# Patient Record
Sex: Male | Born: 1995 | Race: White | Hispanic: No | Marital: Single | Smoking: Never smoker
Health system: Southern US, Community
[De-identification: ages and names within clinical notes are randomized; demographics above are authoritative.]

---
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
## 2017-06-08 ENCOUNTER — Ambulatory Visit (INDEPENDENT_AMBULATORY_CARE_PROVIDER_SITE_OTHER): Payer: BLUE CROSS/BLUE SHIELD | Admitting: Cardiovascular Disease

## 2017-06-08 ENCOUNTER — Encounter: Payer: Self-pay | Admitting: Cardiovascular Disease

## 2017-06-08 VITALS — BP 128/88 | HR 92 | Ht 71.0 in | Wt 137.2 lb

## 2017-06-08 DIAGNOSIS — Z13220 Encounter for screening for lipoid disorders: Secondary | ICD-10-CM | POA: Diagnosis not present

## 2017-06-08 DIAGNOSIS — Z720 Tobacco use: Secondary | ICD-10-CM

## 2017-06-08 DIAGNOSIS — Z79899 Other long term (current) drug therapy: Secondary | ICD-10-CM

## 2017-06-08 DIAGNOSIS — I471 Supraventricular tachycardia: Secondary | ICD-10-CM

## 2017-06-08 DIAGNOSIS — Z789 Other specified health status: Secondary | ICD-10-CM

## 2017-06-08 DIAGNOSIS — F1510 Other stimulant abuse, uncomplicated: Secondary | ICD-10-CM

## 2017-06-08 MED ORDER — METOPROLOL TARTRATE 25 MG PO TABS: 25.0000 mg | ORAL_TABLET | ORAL | 3 refills | Status: DC | PRN

## 2017-06-08 NOTE — Patient Instructions (Signed)
Medication Instructions:  START metoprolol tartrate (Lopressor) 25 mg AS NEEDED --take 1 tablet tonight  Labwork: Please return for FASTING labs (CMET, CBC, Lipid, TSH, Mag, T4-free)  Our in office lab hours are Monday-Friday 8:00-4:00, closed for lunch 12:45-1:45 pm.  No appointment needed.  Testing/Procedures: Your physician has requested that you have an echocardiogram. Echocardiography is a painless test that uses sound waves to create images of your heart. It provides your doctor with information about the size and shape of your heart and how well your heart's chambers and valves are working. This procedure takes approximately one hour. There are no restrictions for this procedure. [facility]:  [address]  Your physician has recommended that you wear a 2 week event monitor. Event monitors are medical devices that record the heart's electrical activity. Doctors most often us these monitors to diagnose arrhythmias. Arrhythmias are problems with the speed or rhythm of the heartbeat. The monitor is a small, portable device. You can wear one while you do your normal daily activities. This is usually used to diagnose what is causing palpitations/syncope (passing out).  Follow-Up: After testing with Dr. Tresa EndoKelly  Any Other Special Instructions Will Be Listed Below (If Applicable).     If you need a refill on your cardiac medications before your next appointment, please call your pharmacy.

## 2017-06-08 NOTE — Progress Notes (Signed)
Cardiology Office Note    Date:  06/10/2017   ID:  John Rowland, DOB 1995/12/30, MRN [id number]  PCP:  Aurelio Jew, MD (Inactive) .  Dr. Lupe Carney  Cardiologist:  Nicki Guadalajara, MD   New cardiology consultation, referred through Ma Hillock, [place] for evaluation of tachycardia.   History of Present Illness:  John Rowland is a 23 y.o. male who was seen at [facility] today by Ma Hillock with a chief complaint of tachycardia.  He was referred for cardiology consultation.  "John Rushing" Rowland  denies any known awareness of prior cardiac disease.  However, he admits to being overactive.  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  Last evening he was still up with friends at around 2 AM when he had to notice an abrupt increase in his heart rate, visual blurriness, decreased hearing, and a vein sharp burning discomfort.  By his calculation his heart rate increased to over 200 bpm.  Ultimately subsided.  This morning he went to work, and again while at work his heart rate began to increase significantly elevated.  He presented to the [facility] and was seen by Ma Hillock. PA.  An ECG by her was reportedly sinus tachycardia at 147.  He was asymptomatic at that time.  She had called the office requesting that he be seen today.  He is being seen by me as an add on.  Upon further questioning, the patient has significant caffeine intake.  He drinks 1-2 energy/vitamin drinks a day of 12 ounces of XS.  Researching this produc  reveals that each of these 12 ounce cans has 125 mg of caffeine.  In addition, he often drinks [facility] as well as sweet tea daily. He has been drinking these energy drinks for at least 6 months.  He does smoke a vape and in past few cigarettes intermittently.  He does use marijuana occasionally.  According to his mother who is here with him today, he has always been somewhat overactive.  He was never treated with ADHD drugs due to their concern.  Of  note, his father has a history of  atrial fibrillation and had undergone ablation.  Upon further questioning, the patient states that while growing up he had noticed periods where his heart rate was seen to race periodically.  He denies any episodes of presyncope or syncope.  He denies any exertional precipitating chest tightness.  He denies PND orthopnea.  He denies difficulty with sleep.  There is no past medical history.  There is no history of prior surgeries.  Current Medications: No outpatient medications prior to visit.   No facility-administered medications prior to visit.      Allergies:   Patient has no known allergies.   Social History   Socioeconomic History  . Marital status: Single    Spouse name: None  . Number of children: None  . Years of education: None  . Highest education level: None  Social Needs  . Financial resource strain: None  . Food insecurity - worry: None  . Food insecurity - inability: None  . Transportation needs - medical: None  . Transportation needs - non-medical: None  Occupational History  . None  Tobacco Use  . Smoking status: Never Smoker  . Smokeless tobacco: Never Used  Substance and Sexual Activity  . Alcohol use: No  . Drug use: No  . Sexual activity: None  Other Topics Concern  . None  Social History Narrative  .  None    He completed 12th grade and graduated from [employer] [place]  Family History:  The patient's family history includes Atrial fibrillation in his father.   ROS General: Negative; No fevers, chills, or night sweats;  HEENT: Negative; No changes in vision or hearing, sinus congestion, difficulty swallowing Pulmonary: Negative; No cough, wheezing, shortness of breath, hemoptysis Cardiovascular:  See HPI GI: Negative; No nausea, vomiting, diarrhea, or abdominal pain GU: Negative; No dysuria, hematuria, or difficulty voiding Musculoskeletal: Negative; no myalgias, joint pain, or  weakness Hematologic/Oncology: Negative; no easy bruising, bleeding Endocrine: Negative; no heat/cold intolerance; no diabetes Neuro: Negative; no changes in balance, headaches Skin: Negative; No rashes or skin lesions Psychiatric: Negative; No behavioral problems, depression Sleep: Negative; No snoring, daytime sleepiness, hypersomnolence, bruxism, restless legs, hypnogognic hallucinations, no cataplexy Other comprehensive 14 point system review is negative.   PHYSICAL EXAM:   VS:  BP 128/88   Pulse 92   Ht 5\' 11"  (1.803 m)   Wt 137 lb 3.2 oz (62.2 kg)   BMI 19.14 kg/m     Repeat blood pressure by me was 122/84  Wt Readings from Last 3 Encounters:  06/08/17 137 lb 3.2 oz (62.2 kg)  11/08/13 142 lb 12.8 oz (64.8 kg) (49 %, Z= -0.03)*  05/20/13 143 lb (64.9 kg) (54 %, Z= 0.11)*   * Growth percentiles are based on CDC (Boys, 2-20 Years) data.    General: Alert, oriented, no distress.  Thin.  BMI of 19  Skin: normal turgor, no rashes, warm and dry HEENT: Normocephalic, atraumatic. Pupils equal round and reactive to light; sclera anicteric; extraocular muscles intact; Fundi normal Nose without nasal septal hypertrophy Mouth/Parynx benign; Mallinpatti scale 2 Neck: No JVD, no carotid bruits; normal carotid upstroke Lungs: clear to ausculatation and percussion; no wheezing or rales Chest wall: without tenderness to palpitationaint Heart: PMI not displaced, RRR, s1 s2 normal, faint 1/6 systolic murmur, no diastolic murmur, no rubs, gallops, thrills, or heaves Abdomen: scaphoid; soft, nontender; no hepatosplenomehaly, BS+; abdominal aorta nontender and not dilated by palpation. Back: no CVA tenderness Pulses 2+ Musculoskeletal: full range of motion, normal strength, no joint deformities Extremities: no clubbing cyanosis or edema, Homan's sign negative  Neurologic: grossly nonfocal; Cranial nerves grossly wnl Psychologic: Normal mood and affect   Studies/Labs Reviewed:   EKG:   EKG is ordered today.  ECG (independently read by me): normal sinus rhythm at 92 bpm.  QTc interval 413 ms.  No ST segment changes.  PR interval 140 ms.  I personally reviewed the ECG that was done at [facility] today which reveals probable ectopic atrial tachycardia with a rate at 147 bpm rather than sinus tachycardia.  Recent Labs: No flowsheet data found.   No flowsheet data found.  No flowsheet data found. No results found for: MCV No results found for: TSH No results found for: HGBA1C   BNP No results found for: BNP  ProBNP No results found for: PROBNP   Lipid Panel  No results found for: CHOL, TRIG, HDL, CHOLHDL, VLDL, LDLCALC, LDLDIRECT   RADIOLOGY: No results found.   Additional studies/ records that were reviewed today include:  Records from [facility]    ASSESSMENT:    1. Ectopic atrial tachycardia (HCC)   2. Medication management   3. Lipid screening   4. 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  Over the past 6 months, he has been drinking drinking 1-2  12 ounce cans of boost/vitamin drink he has several [employer], as well as sweet tea during the day.  He developed onset of rapid tachycardia which he believes his rate had increased to almost 240 last evening.  He developed recurrent tachydysrhythmia earlier today.  His ECG is highly suggestive of ectopic atrial tachycardia rather than sinus rhythm.  A follow-up ECG in our office today shows sinus rhythm at 92 bpm without evidence for preexcitation or accelerated AV conduction.  I had a long talk with both he and his mother.  His symptoms are undoubtedly exacerbated by his significant caffeine intake.  I discussed the importance of discontinuation of caffeine use.  I am scheduling him for an echo Doppler study.  Laboratory will be obtained  including a free T4, TSH, magnesium level, CME, CBC, and lipid studies.  He will wear an event monitor for 2 weeks.  I discussed with him significant lifestyle adjustment.  We discussed the opportunities that he has at age 47 today a difference in his future discussed the importance of discontinuing nicotine use.  I have given him a prescription for metoprolol to additionally take 25 mg today and then as needed.  Hopefully with caffeine reduction symptoms will abate and he will not need at present long-term treatment.  I will see him in 3 weeks for reevaluation.  Medication Adjustments/Labs and Tests Ordered: Current medicines are reviewed at length with the patient today.  Concerns regarding medicines are outlined above.  Medication changes, Labs and Tests ordered today are listed in the Patient Instructions below. Patient Instructions  Medication Instructions:  START metoprolol tartrate (Lopressor) 25 mg AS NEEDED --take 1 tablet tonight  Labwork: Please return for FASTING labs (CMET, CBC, Lipid, TSH, Mag, T4-free)  Our in office lab hours are Monday-Friday 8:00-4:00, closed for lunch 12:45-1:45 pm.  No appointment needed.  Testing/Procedures: Your physician has requested that you have an echocardiogram. Echocardiography is a painless test that uses sound waves to create images of your heart. It provides your doctor with information about the size and shape of your heart and how well your heart's chambers and valves are working. This procedure takes approximately one hour. There are no restrictions for this procedure. This will be done at our [facility] location:  [address]  Your physician has recommended that you wear a 2 week event monitor. Event monitors are medical devices that record the heart's electrical activity. Doctors most often [place] these monitors to diagnose arrhythmias. Arrhythmias are problems with the speed or rhythm of the heartbeat. The monitor is a small,  portable device. You can wear one while you do your normal daily activities. This is usually used to diagnose what is causing palpitations/syncope (passing out).  Follow-Up: After testing with Dr. Tresa Endo  Any Other Special Instructions Will Be Listed Below (If Applicable).     If you need a refill on your cardiac medications before your next appointment, please call your pharmacy.      Signed, Nicki Guadalajara, MD  06/10/2017 9:11 AM    [facility] [address], [place], [place]  [address] Phone: [phone]

## 2017-06-09 ENCOUNTER — Telehealth: Payer: Self-pay | Admitting: Cardiovascular Disease

## 2017-06-09 NOTE — Telephone Encounter (Signed)
Called patient to schedule echo and monitor, but, phone would not accept a voicemail.

## 2017-06-10 ENCOUNTER — Encounter: Payer: Self-pay | Admitting: Cardiovascular Disease

## 2017-06-15 ENCOUNTER — Other Ambulatory Visit (HOSPITAL_COMMUNITY): Payer: BLUE CROSS/BLUE SHIELD

## 2017-06-15 LAB — COMPREHENSIVE METABOLIC PANEL
A/G RATIO: 2.1 (ref 1.2–2.2)
ALBUMIN: 5 g/dL (ref 3.5–5.5)
ALK PHOS: 54 IU/L (ref 39–117)
ALT: 14 IU/L (ref 0–44)
AST: 18 IU/L (ref 0–40)
BILIRUBIN TOTAL: 1.4 mg/dL — AB (ref 0.0–1.2)
BUN / CREAT RATIO: 15 (ref 9–20)
BUN: 14 mg/dL (ref 6–20)
CHLORIDE: 101 mmol/L (ref 96–106)
CO2: 24 mmol/L (ref 20–29)
Calcium: 9.5 mg/dL (ref 8.7–10.2)
Creatinine, Ser: 0.96 mg/dL (ref 0.76–1.27)
GFR calc Af Amer: 131 mL/min/{1.73_m2} (ref >59)
GFR calc non Af Amer: 113 mL/min/{1.73_m2} (ref >59)
GLUCOSE: 73 mg/dL (ref 65–99)
Globulin, Total: 2.4 g/dL (ref 1.5–4.5)
POTASSIUM: 4.2 mmol/L (ref 3.5–5.2)
Sodium: 141 mmol/L (ref 134–144)
Total Protein: 7.4 g/dL (ref 6.0–8.5)

## 2017-06-15 LAB — LIPID PANEL
CHOLESTEROL TOTAL: 140 mg/dL (ref 100–199)
Chol/HDL Ratio: 2.3 ratio (ref 0.0–5.0)
HDL: 61 mg/dL (ref >39)
LDL CALC: 67 mg/dL (ref 0–99)
TRIGLYCERIDES: 60 mg/dL (ref 0–149)
VLDL Cholesterol Cal: 12 mg/dL (ref 5–40)

## 2017-06-15 LAB — CBC
Hematocrit: 49.7 % (ref 37.5–51.0)
Hemoglobin: 17.9 g/dL — ABNORMAL HIGH (ref 13.0–17.7)
MCH: 32 pg (ref 26.6–33.0)
MCHC: 36 g/dL — ABNORMAL HIGH (ref 31.5–35.7)
MCV: 89 fL (ref 79–97)
PLATELETS: 187 10*3/uL (ref 150–379)
RBC: 5.6 x10E6/uL (ref 4.14–5.80)
RDW: 12.9 % (ref 12.3–15.4)
WBC: 5.2 10*3/uL (ref 3.4–10.8)

## 2017-06-15 LAB — TSH: TSH: 0.854 u[IU]/mL (ref 0.450–4.500)

## 2017-06-15 LAB — MAGNESIUM: MAGNESIUM: 2.2 mg/dL (ref 1.6–2.3)

## 2017-06-15 LAB — T4, FREE: Free T4: 1.23 ng/dL (ref 0.82–1.77)

## 2017-06-21 ENCOUNTER — Ambulatory Visit (HOSPITAL_COMMUNITY): Payer: BLUE CROSS/BLUE SHIELD | Attending: Cardiology

## 2017-06-21 ENCOUNTER — Other Ambulatory Visit: Payer: Self-pay

## 2017-06-21 ENCOUNTER — Ambulatory Visit (INDEPENDENT_AMBULATORY_CARE_PROVIDER_SITE_OTHER): Payer: BLUE CROSS/BLUE SHIELD

## 2017-06-21 DIAGNOSIS — I471 Supraventricular tachycardia: Secondary | ICD-10-CM | POA: Insufficient documentation

## 2017-08-01 ENCOUNTER — Ambulatory Visit: Payer: BLUE CROSS/BLUE SHIELD | Admitting: Cardiovascular Disease

## 2017-08-02 ENCOUNTER — Ambulatory Visit (INDEPENDENT_AMBULATORY_CARE_PROVIDER_SITE_OTHER): Payer: BLUE CROSS/BLUE SHIELD | Admitting: Cardiovascular Disease

## 2017-08-02 ENCOUNTER — Encounter: Payer: Self-pay | Admitting: Cardiovascular Disease

## 2017-08-02 VITALS — HR 67 | Ht 71.0 in | Wt 140.8 lb

## 2017-08-02 DIAGNOSIS — F1510 Other stimulant abuse, uncomplicated: Secondary | ICD-10-CM

## 2017-08-02 DIAGNOSIS — I471 Supraventricular tachycardia: Secondary | ICD-10-CM

## 2017-08-02 NOTE — Progress Notes (Signed)
Cardiology Office Note    Date:  08/02/2017   ID:  LUCILLE CRICHLOW, DOB 09/05/1995, MRN [id number]  PCP:  Aurea Graff.Marlou Sa, MD .  Dr. Donnie Coffin  Cardiologist:  Shelva Majestic, MD   F/U cardiology evaluation, referred through Ricka Burdock, [place] for evaluation of tachycardia.   History of Present Illness:  ARASH KARSTENS is a 22 y.o. male who was seen at [facility] today by Ricka Burdock with a chief complaint of tachycardia.  Saw him for initial cardiology consultation on June 08, 2017.  He presents for follow-up evaluation.    "Theresia Lo" Coolman  denies any known awareness of prior cardiac disease.  However, he admits to being overactive.  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  Last evening he was still up with friends at around 2 AM when he had to notice an abrupt increase in his heart rate, visual blurriness, decreased hearing, and a vein sharp burning discomfort.  By his calculation his heart rate increased to over 200 bpm.  Ultimately subsided.  This morning he went to work, and again while at work his heart rate began to increase significantly elevated.  He presented to the [facility] and was seen by Ricka Burdock. PA.  An ECG by her was reportedly sinus tachycardia at 147.  He was asymptomatic at that time.  She had called the office requesting that he be seen today.  He is being seen by me as an add on.  Upon further questioning, the patient has significant caffeine intake.  He drinks 1-2 energy/vitamin drinks a day of 12 ounces of XS.  Researching this produc  reveals that each of these 12 ounce cans has 125 mg of caffeine.  In addition, he often drinks [facility] as well as sweet tea daily. He has been drinking these energy drinks for at least 6 months.  He does smoke a vape and in past few cigarettes intermittently.  He does use marijuana occasionally.  According to his mother who is here with him today, he has always been somewhat overactive.  He was never  treated with ADHD drugs due to their concern.  Of note, his father has a history of  atrial fibrillation and had undergone ablation.  Upon further questioning, the patient states that while growing up he had noticed periods where his heart rate was seen to race periodically.  He denies any episodes of presyncope or syncope orany exertional precipitating chest tightness.  He denies PND orthopnea.  He denies difficulty with sleep.  When I initially saw him, was concerned that his significant caffeine intake and energy drinks were contributing to his episodes of tachycardia.  His ECG and tachycardia was most likely ectopic atrial tachycardia rather than sinus tachycardia.  A long discussion regarding incidence of energy drinks, reduction in caffeine intake and I gave him a prescription for metoprolol to take as needed.  An echo Doppler study was entirely normal and showed an EF of 55 to 60%.  He wore an event monitor from March 27 through July 20, 2017.  This essentially was normal.  He was in sinus rhythm.  There were no episodes of atrial fibrillation or atrial tachycardia.  There were 3 stable episodes of sinus tachycardia which occurred when he was at work.  There was no ectopy.  He has made a significant reduction in his caffeine use.  He denies recurrent tachycardic events.  He presents for reevaluation.  There is no past medical  history.  There is no history of prior surgeries.  Current Medications: Outpatient Medications Prior to Visit  Medication Sig Dispense Refill  . metoprolol tartrate (LOPRESSOR) 25 MG tablet Take 1 tablet (25 mg total) by mouth as needed. 30 tablet 3   No facility-administered medications prior to visit.      Allergies:   Patient has no known allergies.   Social History   Socioeconomic History  . Marital status: Single    Spouse name: Not on file  . Number of children: Not on file  . Years of education: Not on file  . Highest education level: Not on file    Occupational History  . Not on file  Social Needs  . Financial resource strain: Not on file  . Food insecurity:    Worry: Not on file    Inability: Not on file  . Transportation needs:    Medical: Not on file    Non-medical: Not on file  Tobacco Use  . Smoking status: Never Smoker  . Smokeless tobacco: Never Used  Substance and Sexual Activity  . Alcohol use: No  . Drug use: No  . Sexual activity: Not on file  Lifestyle  . Physical activity:    Days per week: Not on file    Minutes per session: Not on file  . Stress: Not on file  Relationships  . Social connections:    Talks on phone: Not on file    Gets together: Not on file    Attends religious service: Not on file    Active member of club or organization: Not on file    Attends meetings of clubs or organizations: Not on file    Relationship status: Not on file  Other Topics Concern  . Not on file  Social History Narrative  . Not on file    He completed 12th grade and graduated from [employer] in [place]  Family History:  The patient's family history includes Atrial fibrillation in his father.   ROS General: Negative; No fevers, chills, or night sweats;  HEENT: Negative; No changes in vision or hearing, sinus congestion, difficulty swallowing Pulmonary: Negative; No cough, wheezing, shortness of breath, hemoptysis Cardiovascular:  See HPI GI: Negative; No nausea, vomiting, diarrhea, or abdominal pain GU: Negative; No dysuria, hematuria, or difficulty voiding Musculoskeletal: Negative; no myalgias, joint pain, or weakness Hematologic/Oncology: Negative; no easy bruising, bleeding Endocrine: Negative; no heat/cold intolerance; no diabetes Neuro: Negative; no changes in balance, headaches Skin: Negative; No rashes or skin lesions Psychiatric: Negative; No behavioral problems, depression Sleep: Negative; No snoring, daytime sleepiness, hypersomnolence, bruxism, restless legs, hypnogognic  hallucinations, no cataplexy Other comprehensive 14 point system review is negative.   PHYSICAL EXAM:   VS:  Pulse 67   Ht _0  (1.803 m)   Wt 140 lb 12.8 oz (63.9 kg)   BMI 19.64 kg/m     Repeat blood pressure by me was 122/84  Wt Readings from Last 3 Encounters:  08/02/17 140 lb 12.8 oz (63.9 kg)  06/08/17 137 lb 3.2 oz (62.2 kg)  11/08/13 142 lb 12.8 oz (64.8 kg) (49 %, Z= -0.03)*   * Growth percentiles are based on CDC (Boys, 2-20 Years) data.    General: Alert, oriented, no distress.  Skin: normal turgor, no rashes, warm and dry HEENT: Normocephalic, atraumatic. Pupils equal round and reactive to light; sclera anicteric; extraocular muscles intact; Nose without nasal septal hypertrophy Mouth/Parynx benign; Mallinpatti scale 2 Neck: No JVD, no carotid  bruits; normal carotid upstroke Lungs: clear to ausculatation and percussion; no wheezing or rales Chest wall: without tenderness to palpitation Heart: PMI not displaced, RRR, s1 s2 normal, 1/6 systolic murmur, no diastolic murmur, no rubs, gallops, thrills, or heaves Abdomen: soft, nontender; no hepatosplenomehaly, BS+; abdominal aorta nontender and not dilated by palpation. Back: no CVA tenderness Pulses 2+ Musculoskeletal: full range of motion, normal strength, no joint deformities Extremities: no clubbing cyanosis or edema, Homan's sign negative  Neurologic: grossly nonfocal; Cranial nerves grossly wnl Psychologic: Normal mood and affect   Studies/Labs Reviewed:   EKG:  EKG is ordered today.  ECG (independently read by me): Normal sinus rhythm with mild sinus arrhythmia with ventricular rate at 67 bpm.  Normal intervals.  No ectopy.  No ST segment changes.  June 08, 2017 ECG (independently read by me): normal sinus rhythm at 92 bpm.  QTc interval 413 ms.  No ST segment changes.  PR interval 140 ms.  I personally reviewed the ECG that was done at [facility] today which reveals probable ectopic atrial tachycardia with a  rate at 147 bpm rather than sinus tachycardia.  Recent Labs: BMP Latest Ref Rng & Units 06/14/2017  Glucose 65 - 99 mg/dL 73  BUN 6 - 20 mg/dL 14  Creatinine 0.76 - 1.27 mg/dL 0.96  BUN/Creat Ratio 9 - 20 15  Sodium 134 - 144 mmol/L 141  Potassium 3.5 - 5.2 mmol/L 4.2  Chloride 96 - 106 mmol/L 101  CO2 20 - 29 mmol/L 24  Calcium 8.7 - 10.2 mg/dL 9.5     Hepatic Function Latest Ref Rng & Units 06/14/2017  Total Protein 6.0 - 8.5 g/dL 7.4  Albumin 3.5 - 5.5 g/dL 5.0  AST 0 - 40 IU/L 18  ALT 0 - 44 IU/L 14  Alk Phosphatase 39 - 117 IU/L 54  Total Bilirubin 0.0 - 1.2 mg/dL 1.4(H)    CBC Latest Ref Rng & Units 06/14/2017  WBC 3.4 - 10.8 x10E3/uL 5.2  Hemoglobin 13.0 - 17.7 g/dL 17.9(H)  Hematocrit 37.5 - 51.0 % 49.7  Platelets 150 - 379 x10E3/uL 187   Lab Results  Component Value Date   MCV 89 06/14/2017   Lab Results  Component Value Date   TSH 0.854 06/14/2017   No results found for: HGBA1C   BNP No results found for: BNP  ProBNP No results found for: PROBNP   Lipid Panel     Component Value Date/Time   CHOL 140 06/14/2017 1221   TRIG 60 06/14/2017 1221   HDL 61 06/14/2017 1221   CHOLHDL 2.3 06/14/2017 1221   LDLCALC 67 06/14/2017 1221     RADIOLOGY: No results found.   Additional studies/ records that were reviewed today include:  Records from [place]:    1. Ectopic atrial tachycardia ([place])   2. Caffeine abuse [facility])     PLAN:  Mr "Theresia Lo" Nolt is a 22 year old male history of most likely being overactive experienced intermittent periods of palpitations.  When I initially saw him, over the 6 months prior to that evaluation he had been drinking excessive amount of caffeine with one or two 12 ounce cans of boost/vitamin drink he has several [employer], as well as sweet tea during the day.  He developed onset of rapid tachycardia which he believes his rate had increased to almost 240 leading to his evaluation.    His ECG was highly  suggestive of ectopic atrial tachycardia rather than sinus rhythm.  A follow-up  ECG in our office demonstrated sinus rhythm at 92 bpm without evidence for preexcitation or accelerated AV conduction.  When I initially saw him, I had a long talk with both he and his mother.  I felt that his symptoms were undoubtedly exacerbated by his significant caffeine intake.  I discussed the importance of discontinuation of caffeine use.  His laboratory was unremarkable.  His echo Doppler study did not reveal any structural heart disease.  He had normal LV function and valvular architecture.  There was no LVH or hypertrophic cardia myopathy pattern.  His 30-day event monitor was unremarkable.  Clinically, his symptoms has read resolved with discontinuance of his significant excessive caffeine use.  Again discussed the importance of continuation of this reduction and also discontinuance of any tobacco use.  Clinically he is stable.  He has a prescription for metoprolol given to him at his last office visit to take as an as-needed basis.  I will not schedule him a follow-up appointment but will be available in the future if recurrent problems develop.  Medication Adjustments/Labs and Tests Ordered: Current medicines are reviewed at length with the patient today.  Concerns regarding medicines are outlined above.  Medication changes, Labs and Tests ordered today are listed in the Patient Instructions below. Patient Instructions  Medication Instructions:  Your physician recommends that you continue on your current medications as directed. Please refer to the Current Medication list given to you today.  Follow-Up: As needed with Dr. Claiborne Billings  Any Other Special Instructions Will Be Listed Below (If Applicable).     If you need a refill on your cardiac medications before your next appointment, please call your pharmacy.      Signed, Shelva Majestic, MD  08/02/2017 10:50 AM    [facility] [address], [place], [place], [place]  [address] Phone: [phone]

## 2017-08-02 NOTE — Patient Instructions (Signed)
Medication Instructions:  Your physician recommends that you continue on your current medications as directed. Please refer to the Current Medication list given to you today.  Follow-Up: As needed with Dr. Kelly  Any Other Special Instructions Will Be Listed Below (If Applicable).     If you need a refill on your cardiac medications before your next appointment, please call your pharmacy.   

## 2020-11-05 ENCOUNTER — Other Ambulatory Visit: Payer: Self-pay

## 2020-11-05 ENCOUNTER — Emergency Department (HOSPITAL_COMMUNITY)
Admission: EM | Admit: 2020-11-05 | Discharge: 2020-11-05 | Disposition: A | Discharge status: Home / Self Care | Payer: Managed Care, Other (non HMO) | Attending: Emergency Medicine | Admitting: Emergency Medicine

## 2020-11-05 ENCOUNTER — Encounter (HOSPITAL_COMMUNITY): Payer: Self-pay

## 2020-11-05 ENCOUNTER — Emergency Department (HOSPITAL_COMMUNITY): Payer: Managed Care, Other (non HMO)

## 2020-11-05 DIAGNOSIS — S50812A Abrasion of left forearm, initial encounter: Secondary | ICD-10-CM | POA: Diagnosis not present

## 2020-11-05 DIAGNOSIS — Y92410 Unspecified street and highway as the place of occurrence of the external cause: Secondary | ICD-10-CM | POA: Insufficient documentation

## 2020-11-05 DIAGNOSIS — R002 Palpitations: Secondary | ICD-10-CM | POA: Insufficient documentation

## 2020-11-05 DIAGNOSIS — M79632 Pain in left forearm: Secondary | ICD-10-CM | POA: Insufficient documentation

## 2020-11-05 DIAGNOSIS — Z23 Encounter for immunization: Secondary | ICD-10-CM | POA: Insufficient documentation

## 2020-11-05 DIAGNOSIS — Z20822 Contact with and (suspected) exposure to covid-19: Secondary | ICD-10-CM | POA: Diagnosis not present

## 2020-11-05 DIAGNOSIS — S80811A Abrasion, right lower leg, initial encounter: Secondary | ICD-10-CM | POA: Insufficient documentation

## 2020-11-05 DIAGNOSIS — S59912A Unspecified injury of left forearm, initial encounter: Secondary | ICD-10-CM | POA: Diagnosis present

## 2020-11-05 LAB — PROTIME-INR
INR: 1.1 (ref 0.8–1.2)
Prothrombin Time: 14.3 seconds (ref 11.4–15.2)

## 2020-11-05 LAB — COMPREHENSIVE METABOLIC PANEL
ALT: 20 U/L (ref 0–44)
AST: 27 U/L (ref 15–41)
Albumin: 4.6 g/dL (ref 3.5–5.0)
Alkaline Phosphatase: 43 U/L (ref 38–126)
Anion gap: 10 (ref 5–15)
BUN: 13 mg/dL (ref 6–20)
CO2: 25 mmol/L (ref 22–32)
Calcium: 9.2 mg/dL (ref 8.9–10.3)
Chloride: 101 mmol/L (ref 98–111)
Creatinine, Ser: 1.01 mg/dL (ref 0.61–1.24)
GFR, Estimated: >60 mL/min (ref >60)
Glucose, Bld: 105 mg/dL — ABNORMAL HIGH (ref 70–99)
Potassium: 3.4 mmol/L — ABNORMAL LOW (ref 3.5–5.1)
Sodium: 136 mmol/L (ref 135–145)
Total Bilirubin: 1.8 mg/dL — ABNORMAL HIGH (ref 0.3–1.2)
Total Protein: 7.2 g/dL (ref 6.5–8.1)

## 2020-11-05 LAB — RESP PANEL BY RT-PCR (FLU A&B, COVID) ARPGX2
Influenza A by PCR: NEGATIVE
Influenza B by PCR: NEGATIVE
SARS Coronavirus 2 by RT PCR: NEGATIVE

## 2020-11-05 LAB — CBC
HCT: 44.5 % (ref 39.0–52.0)
Hemoglobin: 16.4 g/dL (ref 13.0–17.0)
MCH: 32.3 pg (ref 26.0–34.0)
MCHC: 36.9 g/dL — ABNORMAL HIGH (ref 30.0–36.0)
MCV: 87.8 fL (ref 80.0–100.0)
Platelets: 206 10*3/uL (ref 150–400)
RBC: 5.07 MIL/uL (ref 4.22–5.81)
RDW: 11.5 % (ref 11.5–15.5)
WBC: 13.2 10*3/uL — ABNORMAL HIGH (ref 4.0–10.5)
nRBC: 0 % (ref 0.0–0.2)

## 2020-11-05 LAB — SAMPLE TO BLOOD BANK

## 2020-11-05 MED ORDER — TETANUS-DIPHTH-ACELL PERTUSSIS 5-2.5-18.5 LF-MCG/0.5 IM SUSY
0.5000 mL | PREFILLED_SYRINGE | Freq: Once | INTRAMUSCULAR | Status: AC
  Administered 2020-11-05: 0.5 mL via INTRAMUSCULAR
  Filled 2020-11-05: qty 0.5

## 2020-11-05 MED ORDER — IOHEXOL 300 MG/ML  SOLN
50.0000 mL | Freq: Once | INTRAMUSCULAR | Status: AC | PRN
  Administered 2020-11-05: 50 mL via INTRAVENOUS

## 2020-11-05 NOTE — ED Notes (Signed)
RN reviewed discharge instructions w/ pt. Follow up and pain management reviewed, pt had no further questions. 
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
## 2020-11-05 NOTE — Progress Notes (Signed)
Orthopedic Tech Progress Note Patient Details:  John Rowland [birth year] [id number]  Ortho Devices Type of Ortho Device: Shoulder immobilizer Ortho Device/Splint Location: LUE Ortho Device/Splint Interventions: Adjustment, Application, Ordered   Post Interventions Patient Tolerated: Well Instructions Provided: Adjustment of device  [name] [name] 11/05/2020, 11:02 AM
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
## 2020-11-05 NOTE — ED Notes (Signed)
Patient transported to Ultrasound 

## 2020-11-05 NOTE — Discharge Instructions (Addendum)
I recommend a combination of tylenol and ibuprofen for management of your pain. You can take a low dose of both at the same time. I recommend 500 mg of Tylenol combined with 600 mg of ibuprofen. This is one maximum strength Tylenol and three regular ibuprofen. You can take these 2-3 times for day for your pain. Please try to take these medications with a small amount of food as well to prevent upsetting your stomach.  Also, please consider topical pain relieving creams such as Voltaran Gel, BioFreeze, or Icy Hot. There is also a pain relieving cream made by [employer]. You should be able to find all of these at your local pharmacy.   Please adhere to the "RICE Method" for rehabbing. This stands for "rest, ice, compress, and elevate". Please continue to perform stretches and exercise as much as your pain allows. It is important that you keep moving to help heal your injury.   Please continue to wear the sling on your left arm as needed for comfort.  I have given you a work note for the remainder of the week.  Below is the contact information for Dr. Merlyn Lot.  Please give him a call and schedule a follow-up appointment for your left arm.  If you develop any new or worsening symptoms please come back to the emergency department.  It was a pleasure to meet you.
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
## 2023-06-29 ENCOUNTER — Ambulatory Visit: Attending: Orthopedic Surgery

## 2023-06-29 DIAGNOSIS — M6281 Muscle weakness (generalized): Secondary | ICD-10-CM | POA: Diagnosis present

## 2023-06-29 DIAGNOSIS — S42002D Fracture of unspecified part of left clavicle, subsequent encounter for fracture with routine healing: Secondary | ICD-10-CM | POA: Diagnosis present

## 2023-06-29 DIAGNOSIS — M25512 Pain in left shoulder: Secondary | ICD-10-CM | POA: Diagnosis present

## 2023-06-29 DIAGNOSIS — M25612 Stiffness of left shoulder, not elsewhere classified: Secondary | ICD-10-CM | POA: Diagnosis present

## 2023-06-29 NOTE — Therapy (Signed)
 OUTPATIENT PHYSICAL THERAPY CERVICAL EVALUATION   Patient Name: FORREST JAROSZEWSKI MRN: [id number] DOB:[birth year], 28 y.o., male Today's Date: 06/29/2023  END OF SESSION:  PT End of Session - 06/29/23 1615     Visit Number 1    Date for PT Re-Evaluation 09/07/23    Authorization Type [employer]    PT Start Time 1615    PT Stop Time 1700    PT Time Calculation (min) 45 min             History reviewed. No pertinent past medical history. History reviewed. No pertinent surgical history. Patient Active Problem List   Diagnosis Date Noted   Sports physical 11/03/2011    PCP: Lupe Carney  REFERRING PROVIDER: Jones Broom  REFERRING DIAG: Lt clavicle fx  THERAPY DIAG:  Closed nondisplaced fracture of left clavicle with routine healing, unspecified part of clavicle, subsequent encounter  Acute pain of left shoulder  Stiffness of left shoulder, not elsewhere classified  Muscle weakness (generalized)  Rationale for Evaluation and Treatment: Rehabilitation  ONSET DATE: 05/19/23  SUBJECTIVE:                                                                                                                                                                                                         SUBJECTIVE STATEMENT: Feb 21st, I was snowboarding I flipped and landed on my left side on my back. Surgery was on the 27th. I feel fine mobility while but with pressure and lifting anything makes it hurt.   Left handed    PERTINENT HISTORY:  Collarbone sx on 05/25/23  PAIN:  Are you having pain? Yes: NPRS scale: 0/10 at rest, 6/10 if constantly moving it Pain location: L shoulder and up into neck a bit by upper trap Pain description: sharp shooting when it hurts, after driving it is like a constant ache  Aggravating factors: driving after 20 mins, lifting anything, push ups  Relieving factors: Tylenol   PRECAUTIONS: Other: clavicle fx   RED FLAGS: None    WEIGHT BEARING  RESTRICTIONS: No  FALLS:  Has patient fallen in last 6 months? No  LIVING ENVIRONMENT: Lives with: lives with their family Lives in: House/apartment  OCCUPATION: drive forklifts and build decks, etc lots of manual labor   PLOF: Independent  PATIENT GOALS: no pain  NEXT MD VISIT: 6 weeks   OBJECTIVE:  Note: Objective measures were completed at Evaluation unless otherwise noted.  DIAGNOSTIC FINDINGS:  Not in chart    COGNITION: Overall cognitive status: Within functional limits for tasks assessed  SENSATION:  Light touch: Impaired reports numbness from shoulder to center of chest   POSTURE: No Significant postural limitations  PALPATION: Some tightness in L shoulder and pecs    CERVICAL ROM: all WFL  UPPER EXTREMITY ROM:  Active ROM Right eval Left eval  Shoulder flexion  145 w/pain  Shoulder extension    Shoulder abduction  150 w/pain  Shoulder adduction    Shoulder extension    Shoulder internal rotation  WNL  Shoulder external rotation  WNL  Elbow flexion    Elbow extension    Wrist flexion    Wrist extension    Wrist ulnar deviation    Wrist radial deviation    Wrist pronation    Wrist supination     (Blank rows = not tested)  UPPER EXTREMITY MMT:  MMT Right eval Left eval  Shoulder flexion  4/5 with pain  Shoulder extension    Shoulder abduction  4-/5 with pain  Shoulder adduction    Shoulder extension    Shoulder internal rotation  5/5  Shoulder external rotation  5/5  Middle trapezius  4-  Lower trapezius  4-  Elbow flexion    Elbow extension    Wrist flexion    Wrist extension    Wrist ulnar deviation    Wrist radial deviation    Wrist pronation    Wrist supination    Grip strength     (Blank rows = not tested)   TREATMENT DATE: 06/29/23- EVAL                                                                                                                                 PATIENT EDUCATION:  Education details: POC, HEP,  protocol to follow in order to prevent damage  Person educated: Patient Education method: Explanation Education comprehension: verbalized understanding  HOME EXERCISE PROGRAM: Access Code: [email or web address] URL: [email or web address] Date: 06/29/2023 Prepared by: Cassie Freer  Exercises - Shoulder External Rotation with Anchored Resistance  - 1 x daily - 7 x weekly - 2 sets - 10 reps - Standing Shoulder Internal Rotation with Anchored Resistance  - 1 x daily - 7 x weekly - 2 sets - 10 reps - Shoulder External Rotation and Scapular Retraction with Resistance  - 1 x daily - 7 x weekly - 2 sets - 10 reps - Flexion-Extension Shoulder Pendulum with Table Support  - 1 x daily - 7 x weekly - 2 sets - 10 reps - Horizontal Shoulder Pendulum with Table Support  - 1 x daily - 7 x weekly - 2 sets - 10 reps - Circular Shoulder Pendulum with Table Support  - 1 x daily - 7 x weekly - 2 sets - 10 reps  ASSESSMENT:  CLINICAL IMPRESSION: Patient is a 28 y.o. male who was seen today for physical therapy evaluation and treatment for left clavicle fracture. He is currently 5 weeks out from his surgery, no longer wearing a sling.  The doctor told him he is okay to return to work and do activity as long as it does not hurt. He states he tried to do push ups and his pain levels were at a 10/10 after. Advised him to not do these anymore. He was not given a protocol to follow so I found on online to loosely follow to prevent further damage. We discussed no lifting weights over 5-10lb, no lifting overhead, avoiding excessive reaching and ER/IR. 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   OBJECTIVE IMPAIRMENTS: decreased ROM, decreased strength, and pain.   ACTIVITY LIMITATIONS: carrying, lifting, and reach over head  PARTICIPATION LIMITATIONS: cleaning, laundry, driving, occupation, and yard work  [facility] POTENTIAL: Good  CLINICAL DECISION MAKING:  Stable/uncomplicated  EVALUATION COMPLEXITY: Low  GOALS: Goals reviewed with patient? Yes  SHORT TERM GOALS: Target date: 08/03/23  Patient will be independent with initial HEP.  Baseline:  Goal status: INITIAL   LONG TERM GOALS: Target date: 09/07/23  Patient will be independent with advanced/ongoing HEP to improve outcomes and carryover.  Baseline:  Goal status: INITIAL  2.  Patient will report 75% improvement in L shoulder and clavicle pain with activity to improve QOL.  Baseline: depends on the activity it can be between 6-10/10 Goal status: INITIAL  3.  Patient to improve L shoulder AROM to [facility] without pain provocation to allow for increased ease of ADLs.  Baseline: limited flexion and abd with pain Goal status: INITIAL  4.  Patient will be able to drive over 30 mins without pain to complete work tasks    Baseline: pain after 20 mins of driving Goal status: INITIAL   5. Patient will be able to complete push up progression (wall --> table --> full) after 12 weeks Baseline: avoid until routine healing  Goal status: INITIAL    PLAN:  PT FREQUENCY: 1-2x/week  PT DURATION: 10 weeks  PLANNED INTERVENTIONS: 97110-Therapeutic exercises, 97530- Therapeutic activity, 97112- Neuromuscular re-education, 97535- Self Care, 16109- Manual therapy, Patient/Family education, Taping, Joint mobilization, Spinal mobilization, Cryotherapy, and Moist heat  PLAN FOR NEXT SESSION: isometrics, light resistance strengthening as tolerated, bicep curls, triceps   Shreeya Recendiz, PT 06/29/2023, 5:13 PM

## 2023-07-04 ENCOUNTER — Encounter: Payer: Self-pay | Admitting: Physical Therapy

## 2023-07-04 ENCOUNTER — Ambulatory Visit: Admitting: Physical Therapy

## 2023-07-04 DIAGNOSIS — M6281 Muscle weakness (generalized): Secondary | ICD-10-CM

## 2023-07-04 DIAGNOSIS — M25612 Stiffness of left shoulder, not elsewhere classified: Secondary | ICD-10-CM

## 2023-07-04 DIAGNOSIS — S42002D Fracture of unspecified part of left clavicle, subsequent encounter for fracture with routine healing: Secondary | ICD-10-CM | POA: Diagnosis not present

## 2023-07-04 DIAGNOSIS — M25512 Pain in left shoulder: Secondary | ICD-10-CM

## 2023-07-04 NOTE — Therapy (Signed)
 OUTPATIENT PHYSICAL THERAPY CERVICAL TREATMENT   Patient Name: John Rowland MRN: [id number] DOB:Nov 05, 1995, 28 y.o., male Today's Date: 07/04/2023  END OF SESSION:  PT End of Session - 07/04/23 0804     Visit Number 2    Date for PT Re-Evaluation 09/07/23    Authorization Type U[employer]    PT Start Time 0800    PT Stop Time 0843    PT Time Calculation (min) 43 min    Activity Tolerance Patient tolerated treatment well    Behavior During Therapy [facility] for tasks assessed/performed             History reviewed. No pertinent past medical history. History reviewed. No pertinent surgical history. Patient Active Problem List   Diagnosis Date Noted   Sports physical 11/03/2011    PCP: Lupe Carney  REFERRING PROVIDER: Jones Broom  REFERRING DIAG: Lt clavicle fx  THERAPY DIAG:  Closed nondisplaced fracture of left clavicle with routine healing, unspecified part of clavicle, subsequent encounter  Acute pain of left shoulder  Stiffness of left shoulder, not elsewhere classified  Muscle weakness (generalized)  Rationale for Evaluation and Treatment: Rehabilitation  ONSET DATE: 05/19/23  SUBJECTIVE:                                                                                                                                                                                                         SUBJECTIVE STATEMENT: Reports that he wakes up with pain a 4-5/10 most days, any overhead activity hurts  Feb 21st, I was snowboarding I flipped and landed on my left side on my back. Surgery was on the 27th. I feel fine mobility while but with pressure and lifting anything makes it hurt.   Left handed    PERTINENT HISTORY:  Collarbone sx on 05/25/23  PAIN:  Are you having pain? Yes: NPRS scale: 2/10 at rest, 6/10 if constantly moving it Pain location: L shoulder and up into neck a bit by upper trap Pain description: sharp shooting when it hurts, after driving it is  like a constant ache  Aggravating factors: driving after 20 mins, lifting anything, push ups  Relieving factors: Tylenol   PRECAUTIONS: Other: clavicle fx   RED FLAGS: None    WEIGHT BEARING RESTRICTIONS: No  FALLS:  Has patient fallen in last 6 months? No  LIVING ENVIRONMENT: Lives with: lives with their family Lives in: House/apartment  OCCUPATION: drive forklifts and build decks, etc lots of manual labor   PLOF: Independent  PATIENT GOALS: no pain  NEXT MD VISIT: 6 weeks  OBJECTIVE:  Note: Objective measures were completed at Evaluation unless otherwise noted.  DIAGNOSTIC FINDINGS:  Not in chart    COGNITION: Overall cognitive status: Within functional limits for tasks assessed  SENSATION: Light touch: Impaired reports numbness from shoulder to center of chest   POSTURE: No Significant postural limitations  PALPATION: Some tightness in L shoulder and pecs    CERVICAL ROM: all WFL  UPPER EXTREMITY ROM:  Active ROM Right eval Left eval  Shoulder flexion  145 w/pain  Shoulder extension    Shoulder abduction  150 w/pain  Shoulder adduction    Shoulder extension    Shoulder internal rotation  WNL  Shoulder external rotation  WNL  Elbow flexion    Elbow extension    Wrist flexion    Wrist extension    Wrist ulnar deviation    Wrist radial deviation    Wrist pronation    Wrist supination     (Blank rows = not tested)  UPPER EXTREMITY MMT:  MMT Right eval Left eval  Shoulder flexion  4/5 with pain  Shoulder extension    Shoulder abduction  4-/5 with pain  Shoulder adduction    Shoulder extension    Shoulder internal rotation  5/5  Shoulder external rotation  5/5  Middle trapezius  4-  Lower trapezius  4-  Elbow flexion    Elbow extension    Wrist flexion    Wrist extension    Wrist ulnar deviation    Wrist radial deviation    Wrist pronation    Wrist supination    Grip strength     (Blank rows = not tested)   TREATMENT DATE:   07/04/23 UBE level 2 x 4 minutes Ball rolling up wall Wall circles Green tband row Green tband extension Prone 4# row and extension Prone no weight, 2# vectors Supine 3# chest press small angle Supine 4# serratus 4# isometric circles Supine 1# flexion small ROM Supine 2# ER/IR Passive stretch abduction and flexion 25# triceps 10# biceps  06/29/23- EVAL                                                                                                                                 PATIENT EDUCATION:  Education details: POC, HEP, protocol to follow in order to prevent damage  Person educated: Patient Education method: Explanation Education comprehension: verbalized understanding  HOME EXERCISE PROGRAM: Access Code: [email or web address] URL: [email or web address] Date: 06/29/2023 Prepared by: Cassie Freer  Exercises - Shoulder External Rotation with Anchored Resistance  - 1 x daily - 7 x weekly - 2 sets - 10 reps - Standing Shoulder Internal Rotation with Anchored Resistance  - 1 x daily - 7 x weekly - 2 sets - 10 reps - Shoulder External Rotation and Scapular Retraction with Resistance  - 1 x daily - 7 x weekly - 2 sets - 10 reps - Flexion-Extension Shoulder Pendulum with Table Support  - 1 x  daily - 7 x weekly - 2 sets - 10 reps - Horizontal Shoulder Pendulum with Table Support  - 1 x daily - 7 x weekly - 2 sets - 10 reps - Circular Shoulder Pendulum with Table Support  - 1 x daily - 7 x weekly - 2 sets - 10 reps  ASSESSMENT:  CLINICAL IMPRESSION: I initiated more exercises for light strength and ROM, he does have some mild pain discomfort, reports that it feels stiff.  I continued to re-emphasize the need to go slow and very light weight.  He was very weak with the exercises that we did,  Patient is a 28 y.o. male who was seen today for physical therapy evaluation and treatment for left clavicle fracture. He is currently 5 weeks out from his surgery, no longer wearing a  sling. The doctor told him he is okay to return to work and do activity as long as it does not hurt. He states he tried to do push ups and his pain levels were at a 10/10 after. Advised him to not do these anymore. He was not given a protocol to follow so I found on online to loosely follow to prevent further damage. We discussed no lifting weights over 5-10lb, no lifting overhead, avoiding excessive reaching and ER/IR. 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   OBJECTIVE IMPAIRMENTS: decreased ROM, decreased strength, and pain.   ACTIVITY LIMITATIONS: carrying, lifting, and reach over head  PARTICIPATION LIMITATIONS: cleaning, laundry, driving, occupation, and yard work  [facility] POTENTIAL: Good  CLINICAL DECISION MAKING: Stable/uncomplicated  EVALUATION COMPLEXITY: Low  GOALS: Goals reviewed with patient? Yes  SHORT TERM GOALS: Target date: 08/03/23  Patient will be independent with initial HEP.  Baseline:  Goal status: INITIAL   LONG TERM GOALS: Target date: 09/07/23  Patient will be independent with advanced/ongoing HEP to improve outcomes and carryover.  Baseline:  Goal status: INITIAL  2.  Patient will report 75% improvement in L shoulder and clavicle pain with activity to improve QOL.  Baseline: depends on the activity it can be between 6-10/10 Goal status: INITIAL  3.  Patient to improve L shoulder AROM to [facility] without pain provocation to allow for increased ease of ADLs.  Baseline: limited flexion and abd with pain Goal status: INITIAL  4.  Patient will be able to drive over 30 mins without pain to complete work tasks    Baseline: pain after 20 mins of driving Goal status: INITIAL   5. Patient will be able to complete push up progression (wall --> table --> full) after 12 weeks Baseline: avoid until routine healing  Goal status: INITIAL    PLAN:  PT FREQUENCY: 1-2x/week  PT DURATION: 10 weeks  PLANNED INTERVENTIONS:  97110-Therapeutic exercises, 97530- Therapeutic activity, 97112- Neuromuscular re-education, 97535- Self Care, [id number]- Manual therapy, Patient/Family education, Taping, Joint mobilization, Spinal mobilization, Cryotherapy, and Moist heat  PLAN FOR NEXT SESSION: isometrics, light resistance strengthening as tolerated, bicep curls, triceps   Natasha Burda W, PT 07/04/2023, 8:07 AM

## 2023-07-06 ENCOUNTER — Ambulatory Visit: Admitting: Physical Therapy

## 2023-07-06 ENCOUNTER — Encounter: Payer: Self-pay | Admitting: Physical Therapy

## 2023-07-06 DIAGNOSIS — M6281 Muscle weakness (generalized): Secondary | ICD-10-CM

## 2023-07-06 DIAGNOSIS — S42002D Fracture of unspecified part of left clavicle, subsequent encounter for fracture with routine healing: Secondary | ICD-10-CM

## 2023-07-06 DIAGNOSIS — M25512 Pain in left shoulder: Secondary | ICD-10-CM

## 2023-07-06 DIAGNOSIS — M25612 Stiffness of left shoulder, not elsewhere classified: Secondary | ICD-10-CM

## 2023-07-06 NOTE — Therapy (Signed)
 OUTPATIENT PHYSICAL THERAPY CERVICAL TREATMENT   Patient Name: John Rowland MRN: [id number] DOB:1995/06/04, 28 y.o., male Today's Date: 07/06/2023  END OF SESSION:  PT End of Session - 07/06/23 1019     Visit Number 3    Number of Visits 30    Date for PT Re-Evaluation 09/07/23    Authorization Type UHC    PT Start Time 1018    PT Stop Time 1100    PT Time Calculation (min) 42 min    Activity Tolerance Patient tolerated treatment well    Behavior During Therapy WFL for tasks assessed/performed             History reviewed. No pertinent past medical history. History reviewed. No pertinent surgical history. Patient Active Problem List   Diagnosis Date Noted   Sports physical 11/03/2011    PCP: Lupe Carney  REFERRING PROVIDER: Jones Broom  REFERRING DIAG: Lt clavicle fx  THERAPY DIAG:  Closed nondisplaced fracture of left clavicle with routine healing, unspecified part of clavicle, subsequent encounter  Acute pain of left shoulder  Stiffness of left shoulder, not elsewhere classified  Muscle weakness (generalized)  Rationale for Evaluation and Treatment: Rehabilitation  ONSET DATE: 05/19/23  SUBJECTIVE:                                                                                                                                                                                                         SUBJECTIVE STATEMENT: Reports that the day after PT his pain was up to 5-6/10, reports just felt sore in the joint, like we worked  Feb 21st, I was snowboarding I flipped and landed on my left side on my back. Surgery was on the 27th. I feel fine mobility while but with pressure and lifting anything makes it hurt.   Left handed    PERTINENT HISTORY:  Collarbone sx on 05/25/23  PAIN:  Are you having pain? Yes: NPRS scale: 2/10 at rest, 6/10 if constantly moving it Pain location: L shoulder and up into neck a bit by upper trap Pain description:  sharp shooting when it hurts, after driving it is like a constant ache  Aggravating factors: driving after 20 mins, lifting anything, push ups  Relieving factors: Tylenol   PRECAUTIONS: Other: clavicle fx   RED FLAGS: None    WEIGHT BEARING RESTRICTIONS: No  FALLS:  Has patient fallen in last 6 months? No  LIVING ENVIRONMENT: Lives with: lives with their family Lives in: House/apartment  OCCUPATION: drive forklifts and build decks, etc lots of manual labor  PLOF: Independent  PATIENT GOALS: no pain  NEXT MD VISIT: 6 weeks   OBJECTIVE:  Note: Objective measures were completed at Evaluation unless otherwise noted.  DIAGNOSTIC FINDINGS:  Not in chart    COGNITION: Overall cognitive status: Within functional limits for tasks assessed  SENSATION: Light touch: Impaired reports numbness from shoulder to center of chest   POSTURE: No Significant postural limitations  PALPATION: Some tightness in L shoulder and pecs    CERVICAL ROM: all WFL  UPPER EXTREMITY ROM:  Active ROM Right eval Left eval  Shoulder flexion  145 w/pain  Shoulder extension    Shoulder abduction  150 w/pain  Shoulder adduction    Shoulder extension    Shoulder internal rotation  WNL  Shoulder external rotation  WNL  Elbow flexion    Elbow extension    Wrist flexion    Wrist extension    Wrist ulnar deviation    Wrist radial deviation    Wrist pronation    Wrist supination     (Blank rows = not tested)  UPPER EXTREMITY MMT:  MMT Right eval Left eval  Shoulder flexion  4/5 with pain  Shoulder extension    Shoulder abduction  4-/5 with pain  Shoulder adduction    Shoulder extension    Shoulder internal rotation  5/5  Shoulder external rotation  5/5  Middle trapezius  4-  Lower trapezius  4-  Elbow flexion    Elbow extension    Wrist flexion    Wrist extension    Wrist ulnar deviation    Wrist radial deviation    Wrist pronation    Wrist supination    Grip strength      (Blank rows = not tested)   TREATMENT DATE:  07/06/23 UBE level 2 x 6 minutes Red tband row 2x10 Red tband extension 2x10 Red tband ER LAts 15# 2x10 Supine 2# punch Supine 1# flexion Supine 1# ER/IR Supine 3# isometric circles Passive stretch left shoulder all motions to his tolerance  07/04/23 UBE level 2 x 4 minutes Ball rolling up wall Wall circles Green tband row Green tband extension Prone 4# row and extension Prone no weight, 2# vectors Supine 3# chest press small angle Supine 4# serratus 4# isometric circles Supine 1# flexion small ROM Supine 2# ER/IR Passive stretch abduction and flexion 25# triceps 10# biceps  06/29/23- EVAL                                                                                                                                 PATIENT EDUCATION:  Education details: POC, HEP, protocol to follow in order to prevent damage  Person educated: Patient Education method: Explanation Education comprehension: verbalized understanding  HOME EXERCISE PROGRAM: Access Code: [email or web address] URL: [email or web address] Date: 06/29/2023 Prepared by: Cassie Freer  Exercises - Shoulder External Rotation with Anchored Resistance  - 1 x daily - 7 x weekly - 2 sets - 10  reps - Standing Shoulder Internal Rotation with Anchored Resistance  - 1 x daily - 7 x weekly - 2 sets - 10 reps - Shoulder External Rotation and Scapular Retraction with Resistance  - 1 x daily - 7 x weekly - 2 sets - 10 reps - Flexion-Extension Shoulder Pendulum with Table Support  - 1 x daily - 7 x weekly - 2 sets - 10 reps - Horizontal Shoulder Pendulum with Table Support  - 1 x daily - 7 x weekly - 2 sets - 10 reps - Circular Shoulder Pendulum with Table Support  - 1 x daily - 7 x weekly - 2 sets - 10 reps  ASSESSMENT:  CLINICAL IMPRESSION: Patient had some soreness after the last visit, I backed down just a little bit but continued to work on strength, function and ROM,  during the exercises he has mild discomfort but denies pain.  He does have a spot ont he left anterior shoulder seems to be the biceps origin that is tender  Patient is a 28 y.o. male who was seen today for physical therapy evaluation and treatment for left clavicle fracture. He is currently 5 weeks out from his surgery, no longer wearing a sling. The doctor told him he is okay to return to work and do activity as long as it does not hurt. He states he tried to do push ups and his pain levels were at a 10/10 after. Advised him to not do these anymore. He was not given a protocol to follow so I found on online to loosely follow to prevent further damage. We discussed no lifting weights over 5-10lb, no lifting overhead, avoiding excessive reaching and ER/IR. 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   OBJECTIVE IMPAIRMENTS: decreased ROM, decreased strength, and pain.   ACTIVITY LIMITATIONS: carrying, lifting, and reach over head  PARTICIPATION LIMITATIONS: cleaning, laundry, driving, occupation, and yard work  [facility] POTENTIAL: Good  CLINICAL DECISION MAKING: Stable/uncomplicated  EVALUATION COMPLEXITY: Low  GOALS: Goals reviewed with patient? Yes  SHORT TERM GOALS: Target date: 08/03/23  Patient will be independent with initial HEP.  Baseline:  Goal status: met 07/06/23   LONG TERM GOALS: Target date: 09/07/23  Patient will be independent with advanced/ongoing HEP to improve outcomes and carryover.  Baseline:  Goal status: INITIAL  2.  Patient will report 75% improvement in L shoulder and clavicle pain with activity to improve QOL.  Baseline: depends on the activity it can be between 6-10/10 Goal status: INITIAL  3.  Patient to improve L shoulder AROM to [facility] without pain provocation to allow for increased ease of ADLs.  Baseline: limited flexion and abd with pain Goal status: INITIAL  4.  Patient will be able to drive over 30 mins without  pain to complete work tasks    Baseline: pain after 20 mins of driving Goal status: INITIAL   5. Patient will be able to complete push up progression (wall --> table --> full) after 12 weeks Baseline: avoid until routine healing  Goal status: INITIAL    PLAN:  PT FREQUENCY: 1-2x/week  PT DURATION: 10 weeks  PLANNED INTERVENTIONS: 97110-Therapeutic exercises, 97530- Therapeutic activity, 97112- Neuromuscular re-education, 97535- Self Care, 16109- Manual therapy, Patient/Family education, Taping, Joint mobilization, Spinal mobilization, Cryotherapy, and Moist heat  PLAN FOR NEXT SESSION: isometrics, light resistance strengthening as tolerated, bicep curls, triceps   Nikiesha Milford W, PT 07/06/2023, 10:20 AM

## 2023-07-12 ENCOUNTER — Ambulatory Visit

## 2023-07-12 DIAGNOSIS — S42002D Fracture of unspecified part of left clavicle, subsequent encounter for fracture with routine healing: Secondary | ICD-10-CM

## 2023-07-12 DIAGNOSIS — M25612 Stiffness of left shoulder, not elsewhere classified: Secondary | ICD-10-CM

## 2023-07-12 DIAGNOSIS — M25512 Pain in left shoulder: Secondary | ICD-10-CM

## 2023-07-12 DIAGNOSIS — M6281 Muscle weakness (generalized): Secondary | ICD-10-CM

## 2023-07-12 NOTE — Therapy (Signed)
 OUTPATIENT PHYSICAL THERAPY CERVICAL TREATMENT   Patient Name: John Rowland MRN: [id number] DOB:01/16/1996, 28 y.o., male Today's Date: 07/12/2023  END OF SESSION:  PT End of Session - 07/12/23 1613     Visit Number 4    Number of Visits 30    Date for PT Re-Evaluation 09/07/23    Authorization Type [employer]    PT Start Time 1613    PT Stop Time 1655    PT Time Calculation (min) 42 min    Activity Tolerance Patient tolerated treatment well    Behavior During Therapy [facility] for tasks assessed/performed              History reviewed. No pertinent past medical history. History reviewed. No pertinent surgical history. Patient Active Problem List   Diagnosis Date Noted   Sports physical 11/03/2011    PCP: Norvel Beer  REFERRING PROVIDER: Sammye Cristal  REFERRING DIAG: Lt clavicle fx  THERAPY DIAG:  Closed nondisplaced fracture of left clavicle with routine healing, unspecified part of clavicle, subsequent encounter  Acute pain of left shoulder  Stiffness of left shoulder, not elsewhere classified  Muscle weakness (generalized)  Rationale for Evaluation and Treatment: Rehabilitation  ONSET DATE: 05/19/23  SUBJECTIVE:                                                                                                                                                                                                         SUBJECTIVE STATEMENT: Its feeling a lot better, it definitely think this is helping. Just having pain when I wake up, it feels stiff when I get up.   EVAL- Feb 21st, I was snowboarding I flipped and landed on my left side on my back. Surgery was on the 27th. I feel fine mobility while but with pressure and lifting anything makes it hurt.   Left handed    PERTINENT HISTORY:  Collarbone sx on 05/25/23  PAIN:  Are you having pain? Yes: NPRS scale: 2/10 at rest, 6/10 if constantly moving it Pain location: L shoulder and up into neck a bit by upper  trap Pain description: sharp shooting when it hurts, after driving it is like a constant ache  Aggravating factors: driving after 20 mins, lifting anything, push ups  Relieving factors: Tylenol   PRECAUTIONS: Other: clavicle fx   RED FLAGS: None    WEIGHT BEARING RESTRICTIONS: No  FALLS:  Has patient fallen in last 6 months? 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  DIAGNOSTIC FINDINGS:  Not in chart    COGNITION: Overall cognitive status: Within functional limits for tasks assessed  SENSATION: Light touch: Impaired reports numbness from shoulder to center of chest   POSTURE: No Significant postural limitations  PALPATION: Some tightness in L shoulder and pecs    CERVICAL ROM: all WFL  UPPER EXTREMITY ROM:  Active ROM Right eval Left eval  Shoulder flexion  145 w/pain  Shoulder extension    Shoulder abduction  150 w/pain  Shoulder adduction    Shoulder extension    Shoulder internal rotation  WNL  Shoulder external rotation  WNL  Elbow flexion    Elbow extension    Wrist flexion    Wrist extension    Wrist ulnar deviation    Wrist radial deviation    Wrist pronation    Wrist supination     (Blank rows = not tested)  UPPER EXTREMITY MMT:  MMT Right eval Left eval  Shoulder flexion  4/5 with pain  Shoulder extension    Shoulder abduction  4-/5 with pain  Shoulder adduction    Shoulder extension    Shoulder internal rotation  5/5  Shoulder external rotation  5/5  Middle trapezius  4-  Lower trapezius  4-  Elbow flexion    Elbow extension    Wrist flexion    Wrist extension    Wrist ulnar deviation    Wrist radial deviation    Wrist pronation    Wrist  supination    Grip strength     (Blank rows = not tested)   TREATMENT DATE:  07/12/23 NuStep L5x77mins  Green band rows and ext 2x10 Red band ER 2x10 IYT 3# 2x10 Chest press 5# 2x10 Bicep curls 15# 2x10 Tricep ext 25# 2x10 Shoulder ext 5# 2x10 4# bent over row 2x10 4# chest fly 2x10  07/06/23 UBE level 2 x 6 minutes Red tband row 2x10 Red tband extension 2x10 Red tband ER LAts 15# 2x10 Supine 2# punch Supine 1# flexion Supine 1# ER/IR Supine 3# isometric circles Passive stretch left shoulder all motions to his tolerance  07/04/23 UBE level 2 x 4 minutes Ball rolling up wall Wall circles Green tband row Green tband extension Prone 4# row and extension Prone no weight, 2# vectors Supine 3# chest press small angle Supine 4# serratus 4# isometric circles Supine 1# flexion small ROM Supine 2# ER/IR Passive stretch abduction and flexion 25# triceps 10# biceps  06/29/23- EVAL                                                                                                                                 PATIENT EDUCATION:  Education details: POC, HEP, protocol to follow in order to prevent  damage  Person educated: Patient Education method: Explanation Education comprehension: verbalized understanding  HOME EXERCISE PROGRAM: Access Code: [email or web address] URL: [email or web address] Date: 06/29/2023 Prepared by: Cassie Freer  Exercises - Shoulder External Rotation with Anchored Resistance  - 1 x daily - 7 x weekly - 2 sets - 10 reps - Standing Shoulder Internal Rotation with Anchored Resistance  - 1 x daily - 7 x weekly - 2 sets - 10 reps - Shoulder External Rotation and Scapular Retraction with Resistance  - 1 x daily - 7 x weekly - 2 sets - 10 reps - Flexion-Extension Shoulder Pendulum with Table Support  - 1 x daily - 7 x weekly - 2 sets - 10 reps - Horizontal Shoulder Pendulum with Table Support  - 1 x daily - 7 x weekly - 2 sets - 10 reps - Circular  Shoulder Pendulum with Table Support  - 1 x daily - 7 x weekly - 2 sets - 10 reps  ASSESSMENT:  CLINICAL IMPRESSION: Patient had some stiffness in the mornings but denies pain and feels the PT is helping him. Continued to work on strength, function and ROM, during the exercises he has mild discomfort and is given cues to not push into pain. Some shaking in LUE with lateral raises using 3# and 5# shoulder ext with cables. Continue to progress as tolerated, preventing further injury or harm.   Patient is a 28 y.o. male who was seen today for physical therapy evaluation and treatment for left clavicle fracture. He is currently 5 weeks out from his surgery, no longer wearing a sling. The doctor told him he is okay to return to work and do activity as long as it does not hurt. He states he tried to do push ups and his pain levels were at a 10/10 after. Advised him to not do these anymore. He was not given a protocol to follow so I found on online to loosely follow to prevent further damage. We discussed no lifting weights over 5-10lb, no lifting overhead, avoiding excessive reaching and ER/IR. 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   OBJECTIVE IMPAIRMENTS: decreased ROM, decreased strength, and pain.   ACTIVITY LIMITATIONS: carrying, lifting, and reach over head  PARTICIPATION LIMITATIONS: cleaning, laundry, driving, occupation, and yard work  [facility] POTENTIAL: Good  CLINICAL DECISION MAKING: Stable/uncomplicated  EVALUATION COMPLEXITY: Low  GOALS: Goals reviewed with patient? Yes  SHORT TERM GOALS: Target date: 08/03/23  Patient will be independent with initial HEP.  Baseline:  Goal status: met 07/06/23   LONG TERM GOALS: Target date: 09/07/23  Patient will be independent with advanced/ongoing HEP to improve outcomes and carryover.  Baseline:  Goal status: INITIAL  2.  Patient will report 75% improvement in L shoulder and clavicle pain with  activity to improve QOL.  Baseline: depends on the activity it can be between 6-10/10 Goal status: INITIAL  3.  Patient to improve L shoulder AROM to [facility] without pain provocation to allow for increased ease of ADLs.  Baseline: limited flexion and abd with pain Goal status: INITIAL  4.  Patient will be able to drive over 30 mins without pain to complete work tasks    Baseline: pain after 20 mins of driving Goal status: INITIAL   5. Patient will be able to complete push up progression (wall --> table --> full) after 12 weeks Baseline: avoid until routine healing  Goal status: INITIAL    PLAN:  PT FREQUENCY: 1-2x/week  PT DURATION: 10 weeks  PLANNED INTERVENTIONS: 97110-Therapeutic exercises, 97530- Therapeutic activity, 97112- Neuromuscular re-education, 97535- Self Care, 11914- Manual therapy, Patient/Family education, Taping, Joint mobilization, Spinal mobilization, Cryotherapy, and Moist heat  PLAN FOR NEXT SESSION: isometrics, light resistance strengthening as tolerated, bicep curls, triceps   Donavon Fudge, PT 07/12/2023, 5:03 PM

## 2023-07-13 ENCOUNTER — Ambulatory Visit: Admitting: Physical Therapy

## 2023-07-19 ENCOUNTER — Encounter: Payer: Self-pay | Admitting: Physical Therapy

## 2023-07-19 ENCOUNTER — Ambulatory Visit: Admitting: Physical Therapy

## 2023-07-19 DIAGNOSIS — M25512 Pain in left shoulder: Secondary | ICD-10-CM

## 2023-07-19 DIAGNOSIS — M6281 Muscle weakness (generalized): Secondary | ICD-10-CM

## 2023-07-19 DIAGNOSIS — M25612 Stiffness of left shoulder, not elsewhere classified: Secondary | ICD-10-CM

## 2023-07-19 DIAGNOSIS — S42002D Fracture of unspecified part of left clavicle, subsequent encounter for fracture with routine healing: Secondary | ICD-10-CM | POA: Diagnosis not present

## 2023-07-19 NOTE — Therapy (Signed)
 OUTPATIENT PHYSICAL THERAPY CERVICAL TREATMENT   Patient Name: John Rowland MRN: [id number] DOB:[birth year], 28 y.o., male Today's Date: 07/19/2023  END OF SESSION:  PT End of Session - 07/19/23 0931     Visit Number 5    Number of Visits 30    Date for PT Re-Evaluation 09/07/23    Authorization Type [employer]    PT Start Time 0930    PT Stop Time 1015    PT Time Calculation (min) 45 min              History reviewed. No pertinent past medical history. History reviewed. No pertinent surgical history. Patient Active Problem List   Diagnosis Date Noted   Sports physical 11/03/2011    PCP: Norvel Beer  REFERRING PROVIDER: Sammye Cristal  REFERRING DIAG: Lt clavicle fx  THERAPY DIAG:  Acute pain of left shoulder  Stiffness of left shoulder, not elsewhere classified  Muscle weakness (generalized)  Rationale for Evaluation and Treatment: Rehabilitation  ONSET DATE: 05/19/23  SUBJECTIVE:                                                                                                                                                                                                         SUBJECTIVE STATEMENT: Its feeling a lot better. He is feeling stronger and has more mobility. He feels like his home exercises are helping.  EVAL- Feb 21st, I was snowboarding I flipped and landed on my left side on my back. Surgery was on the 27th. I feel fine mobility while but with pressure and lifting anything makes it hurt.   Left handed    PERTINENT HISTORY:  Collarbone sx on 05/25/23  PAIN:  Are you having pain? Yes: NPRS scale: 0/10  Pain location: L shoulder and up into neck a bit by upper trap Pain description: sharp shooting when it hurts, after driving it is like a constant ache  Aggravating factors: driving after 20 mins, lifting anything, push ups  Relieving factors: Tylenol   PRECAUTIONS: Other: clavicle fx   RED FLAGS: None    WEIGHT BEARING  RESTRICTIONS: No  FALLS:  Has patient fallen in last 6 months? 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  DIAGNOSTIC FINDINGS:  Not in chart    COGNITION: Overall  cognitive status: Within functional limits for tasks assessed  SENSATION: Light touch: Impaired reports numbness from shoulder to center of chest   POSTURE: No Significant postural limitations  PALPATION: Some tightness in L shoulder and pecs    CERVICAL ROM: all WFL  UPPER EXTREMITY ROM:  Active ROM Right eval Left eval  Shoulder flexion  145 w/pain  Shoulder extension    Shoulder abduction  150 w/pain  Shoulder adduction    Shoulder extension    Shoulder internal rotation  WNL  Shoulder external rotation  WNL  Elbow flexion    Elbow extension    Wrist flexion    Wrist extension    Wrist ulnar deviation    Wrist radial deviation    Wrist pronation    Wrist supination     (Blank rows = not tested)  UPPER EXTREMITY MMT:  MMT Right eval Left eval  Shoulder flexion  4/5 with pain  Shoulder extension    Shoulder abduction  4-/5 with pain  Shoulder adduction    Shoulder extension    Shoulder internal rotation  5/5  Shoulder external rotation  5/5  Middle trapezius  4-  Lower trapezius  4-  Elbow flexion    Elbow extension    Wrist flexion    Wrist extension    Wrist ulnar deviation    Wrist radial deviation    Wrist pronation    Wrist supination    Grip strength     (Blank rows = not tested)   TREATMENT DATE:  07/19/23 UBE L3 3 min each way Cable  ER 2x10 10#   lateral raise 10# 2x10  Front raise10# 2x12  Ext. 10#  2x12 Red band wall taps 3 targets 2x10 each Pushup with a plus 3x12- elevated  table Bent over row 2x10 7# Prone rev fly 2# 2x10- cue to squeeze shoulder blades together   07/12/23 NuStep L5x77mins  Green band rows and ext 2x10 Red band ER 2x10 IYT 3# 2x10 Chest press 5# 2x10 Bicep curls 15# 2x10 Tricep ext 25# 2x10 Shoulder ext 5# 2x10 4# bent over row 2x10 4# chest fly 2x10  07/06/23 UBE level 2 x 6 minutes Red tband row 2x10 Red tband extension 2x10 Red tband ER LAts 15# 2x10 Supine 2# punch Supine 1# flexion Supine 1# ER/IR Supine 3# isometric circles Passive stretch left shoulder all motions to his tolerance  07/04/23 UBE level 2 x 4 minutes Ball rolling up wall Wall circles Green tband row Green tband extension Prone 4# row and extension Prone no weight, 2# vectors Supine 3# chest press small angle Supine 4# serratus 4# isometric circles Supine 1# flexion small ROM Supine 2# ER/IR Passive stretch abduction and flexion 25# triceps 10# biceps  06/29/23- EVAL  PATIENT EDUCATION:  Education details: POC, HEP, protocol to follow in order to prevent damage  Person educated: Patient Education method: Explanation Education comprehension: verbalized understanding  HOME EXERCISE PROGRAM: Access Code: [email or web address] URL: [email or web address] Date: 06/29/2023 Prepared by: Donavon Fudge  Exercises - Shoulder External Rotation with Anchored Resistance  - 1 x daily - 7 x weekly - 2 sets - 10 reps - Standing Shoulder Internal Rotation with Anchored Resistance  - 1 x daily - 7 x weekly - 2 sets - 10 reps - Shoulder External Rotation and Scapular Retraction with Resistance  - 1 x daily - 7 x weekly - 2 sets - 10 reps - Flexion-Extension Shoulder Pendulum with Table Support  - 1 x daily - 7 x weekly - 2 sets - 10 reps - Horizontal Shoulder Pendulum with Table Support  - 1 x daily - 7 x weekly - 2 sets - 10 reps -  Circular Shoulder Pendulum with Table Support  - 1 x daily - 7 x weekly - 2 sets - 10 reps  ASSESSMENT:  CLINICAL IMPRESSION: Patient came in with no pain and tolerated all exercises. He needed cuing with rows and prone flys to squeeze his shoulder blades together and slow down his tempo. He has some discomfort with pushups and Prone fly, but said it was tightness and not pain. He is visibly strong with all shoulder motions and demonstrated good ROM.   Patient is a 28 y.o. male who was seen today for physical therapy evaluation and treatment for left clavicle fracture. He is currently 5 weeks out from his surgery, no longer wearing a sling. The doctor told him he is okay to return to work and do activity as long as it does not hurt. He states he tried to do push ups and his pain levels were at a 10/10 after. Advised him to not do these anymore. He was not given a protocol to follow so I found on online to loosely follow to prevent further damage. We discussed no lifting weights over 5-10lb, no lifting overhead, avoiding excessive reaching and ER/IR. 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   OBJECTIVE IMPAIRMENTS: decreased ROM, decreased strength, and pain.   ACTIVITY LIMITATIONS: carrying, lifting, and reach over head  PARTICIPATION LIMITATIONS: cleaning, laundry, driving, occupation, and yard work  [facility] POTENTIAL: Good  CLINICAL DECISION MAKING: Stable/uncomplicated  EVALUATION COMPLEXITY: Low  GOALS: Goals reviewed with patient? Yes  SHORT TERM GOALS: Target date: 08/03/23  Patient will be independent with initial HEP.  Baseline:  Goal status: met 07/06/23   LONG TERM GOALS: Target date: 09/07/23  Patient will be independent with advanced/ongoing HEP to improve outcomes and carryover.  Baseline:  Goal status: INITIAL  2.  Patient will report 75% improvement in L shoulder and clavicle pain with activity to improve QOL.  Baseline:  depends on the activity it can be between 6-10/10 Goal status: INITIAL  3.  Patient to improve L shoulder AROM to [facility] without pain provocation to allow for increased ease of ADLs.  Baseline: limited flexion and abd with pain Goal status: INITIAL  4.  Patient will be able to drive over 30 mins without pain to complete work tasks    Baseline: pain after 20 mins of driving Goal status: INITIAL   5. Patient will be able to complete push up progression (wall --> table --> full) after 12 weeks Baseline: avoid until routine healing  Goal status: INITIAL  PLAN:  PT FREQUENCY: 1-2x/week  PT DURATION: 10 weeks  PLANNED INTERVENTIONS: 97110-Therapeutic exercises, 97530- Therapeutic activity, 97112- Neuromuscular re-education, 97535- Self Care, 21308- Manual therapy, Patient/Family education, Taping, Joint mobilization, Spinal mobilization, Cryotherapy, and Moist heat  PLAN FOR NEXT SESSION: Progress resistance strengthening as tolerated, bicep curls, triceps   Laurelyn Ponder, SPTA 07/19/2023, 9:32 AM

## 2023-07-20 NOTE — Therapy (Signed)
 OUTPATIENT PHYSICAL THERAPY CERVICAL TREATMENT   Patient Name: John Rowland MRN: [id number] DOB:03-23-1996, 28 y.o., male Today's Date: 07/21/2023  END OF SESSION:  PT End of Session - 07/21/23 0926     Visit Number 6    Number of Visits 30    Date for PT Re-Evaluation 09/07/23    Authorization Type [employer]    PT Start Time 0930    PT Stop Time 1015    PT Time Calculation (min) 45 min               History reviewed. No pertinent past medical history. History reviewed. No pertinent surgical history. Patient Active Problem List   Diagnosis Date Noted   Sports physical 11/03/2011    PCP: Norvel Beer  REFERRING PROVIDER: Sammye Cristal  REFERRING DIAG: Lt clavicle fx  THERAPY DIAG:  Acute pain of left shoulder  Stiffness of left shoulder, not elsewhere classified  Muscle weakness (generalized)  Closed nondisplaced fracture of left clavicle with routine healing, unspecified part of clavicle, subsequent encounter  Rationale for Evaluation and Treatment: Rehabilitation  ONSET DATE: 05/19/23  SUBJECTIVE:                                                                                                                                                                                                         SUBJECTIVE STATEMENT: I feel like I have improved a lot in the last week. Only hurts if I do certain movements or try to lift or push off of it.    EVAL- Feb 21st, I was snowboarding I flipped and landed on my left side on my back. Surgery was on the 27th. I feel fine mobility while but with pressure and lifting anything makes it hurt.   Left handed    PERTINENT HISTORY:  Collarbone sx on 05/25/23  PAIN:  Are you having pain? Yes: NPRS scale: 0/10  Pain location: L shoulder and up into neck a bit by upper trap Pain description: sharp shooting when it hurts, after driving it is like a constant ache  Aggravating factors: driving after 20 mins, lifting  anything, push ups  Relieving factors: Tylenol   PRECAUTIONS: Other: clavicle fx   RED FLAGS: None    WEIGHT BEARING RESTRICTIONS: No  FALLS:  Has patient fallen in last 6 months? 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  DIAGNOSTIC FINDINGS:  Not in chart    COGNITION: Overall cognitive status: Within functional limits for tasks assessed  SENSATION: Light touch: Impaired reports numbness from shoulder to center of chest   POSTURE: No Significant postural limitations  PALPATION: Some tightness in L shoulder and pecs    CERVICAL ROM: all WFL  UPPER EXTREMITY ROM:  Active ROM Right eval Left eval  Shoulder flexion  145 w/pain  Shoulder extension    Shoulder abduction  150 w/pain  Shoulder adduction    Shoulder extension    Shoulder internal rotation  WNL  Shoulder external rotation  WNL  Elbow flexion    Elbow extension    Wrist flexion    Wrist extension    Wrist ulnar deviation    Wrist radial deviation    Wrist pronation    Wrist supination     (Blank rows = not tested)  UPPER EXTREMITY MMT:  MMT Right eval Left eval  Shoulder flexion  4/5 with pain  Shoulder extension    Shoulder abduction  4-/5 with pain  Shoulder adduction    Shoulder extension    Shoulder internal rotation  5/5  Shoulder external rotation  5/5  Middle trapezius  4-  Lower trapezius  4-  Elbow flexion    Elbow extension    Wrist flexion    Wrist extension    Wrist ulnar deviation    Wrist radial deviation    Wrist pronation    Wrist supination    Grip strength     (Blank rows = not tested)   TREATMENT DATE:  07/21/23 UBE L3 x30mins each Wall push up 2x10 Shoulder taps  standing at wall 2x10 alt  3 way reaches on wall red band 2x10  Face pull with red band 2x10 Body blade working on stability 3 x30s both ways Chest press 10# 2x10 Prone IYT with 3# 2x10 Arnold curl 3# 2x10  SA lift red 2x10    07/19/23 UBE L3 3 min each way Cable  ER 2x10 10#   lateral raise 10# 2x10  Front raise10# 2x12  Ext. 10#  2x12 Red band wall taps 3 targets 2x10 each Pushup with a plus 3x12- elevated table Bent over row 2x10 7# Prone rev fly 2# 2x10- cue to squeeze shoulder blades together   07/12/23 NuStep L5x50mins  Green band rows and ext 2x10 Red band ER 2x10 IYT 3# 2x10 Chest press 5# 2x10 Bicep curls 15# 2x10 Tricep ext 25# 2x10 Shoulder ext 5# 2x10 4# bent over row 2x10 4# chest fly 2x10  07/06/23 UBE level 2 x 6 minutes Red tband row 2x10 Red tband extension 2x10 Red tband ER LAts 15# 2x10 Supine 2# punch Supine 1# flexion Supine 1# ER/IR Supine 3# isometric circles Passive stretch left shoulder all motions to his tolerance  07/04/23 UBE level 2 x 4 minutes Ball rolling up wall Wall circles Green tband row Green tband extension Prone 4# row and extension Prone no weight, 2# vectors Supine 3# chest press small angle Supine 4# serratus 4# isometric circles Supine 1# flexion small ROM Supine 2# ER/IR Passive stretch abduction and flexion 25# triceps 10# biceps  06/29/23- EVAL  PATIENT EDUCATION:  Education details: POC, HEP, protocol to follow in order to prevent damage  Person educated: Patient Education method: Explanation Education comprehension: verbalized understanding  HOME EXERCISE PROGRAM: Access Code: [email or web address] URL: [email or web address] Date: 06/29/2023 Prepared by: Donavon Fudge  Exercises - Shoulder External Rotation with Anchored Resistance  - 1 x daily - 7 x weekly - 2 sets - 10  reps - Standing Shoulder Internal Rotation with Anchored Resistance  - 1 x daily - 7 x weekly - 2 sets - 10 reps - Shoulder External Rotation and Scapular Retraction with Resistance  - 1 x daily - 7 x weekly - 2 sets - 10 reps - Flexion-Extension Shoulder Pendulum with Table Support  - 1 x daily - 7 x weekly - 2 sets - 10 reps - Horizontal Shoulder Pendulum with Table Support  - 1 x daily - 7 x weekly - 2 sets - 10 reps - Circular Shoulder Pendulum with Table Support  - 1 x daily - 7 x weekly - 2 sets - 10 reps  ASSESSMENT:  CLINICAL IMPRESSION: Patient came in with no pain and feels he is making good progress. We focused mainly on stability interventions today. Some tenderness at bicep origin with chest press. Shaking noted with prone T and SA lifts with band, will continue to benefit from scapular and shoulder strengthening.    Patient is a 28 y.o. male who was seen today for physical therapy evaluation and treatment for left clavicle fracture. He is currently 5 weeks out from his surgery, no longer wearing a sling. The doctor told him he is okay to return to work and do activity as long as it does not hurt. He states he tried to do push ups and his pain levels were at a 10/10 after. Advised him to not do these anymore. He was not given a protocol to follow so I found on online to loosely follow to prevent further damage. We discussed no lifting weights over 5-10lb, no lifting overhead, avoiding excessive reaching and ER/IR. 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   OBJECTIVE IMPAIRMENTS: decreased ROM, decreased strength, and pain.   ACTIVITY LIMITATIONS: carrying, lifting, and reach over head  PARTICIPATION LIMITATIONS: cleaning, laundry, driving, occupation, and yard work  [facility] POTENTIAL: Good  CLINICAL DECISION MAKING: Stable/uncomplicated  EVALUATION COMPLEXITY: Low  GOALS: Goals reviewed with patient? Yes  SHORT TERM GOALS: Target  date: 08/03/23  Patient will be independent with initial HEP.  Baseline:  Goal status: met 07/06/23   LONG TERM GOALS: Target date: 09/07/23  Patient will be independent with advanced/ongoing HEP to improve outcomes and carryover.  Baseline:  Goal status: INITIAL  2.  Patient will report 75% improvement in L shoulder and clavicle pain with activity to improve QOL.  Baseline: depends on the activity it can be between 6-10/10 Goal status: INITIAL  3.  Patient to improve L shoulder AROM to [facility] without pain provocation to allow for increased ease of ADLs.  Baseline: limited flexion and abd with pain Goal status: INITIAL  4.  Patient will be able to drive over 30 mins without pain to complete work tasks    Baseline: pain after 20 mins of driving Goal status: INITIAL   5. Patient will be able to complete push up progression (wall --> table --> full) after 12 weeks Baseline: avoid until routine healing  Goal status: INITIAL    PLAN:  PT FREQUENCY: 1-2x/week  PT DURATION: 10 weeks  PLANNED INTERVENTIONS: 97110-Therapeutic exercises, 97530- Therapeutic activity, [id number]- Neuromuscular re-education, 97535- Self Care, [id number]- Manual therapy, Patient/Family education, Taping, Joint mobilization, Spinal mobilization, Cryotherapy, and Moist heat  PLAN FOR NEXT SESSION: Progress resistance strengthening as tolerated, bicep curls, triceps   Caleel Kiner, PT, SPTA 07/21/2023, 10:15 AM

## 2023-07-21 ENCOUNTER — Ambulatory Visit

## 2023-07-21 DIAGNOSIS — S42002D Fracture of unspecified part of left clavicle, subsequent encounter for fracture with routine healing: Secondary | ICD-10-CM

## 2023-07-21 DIAGNOSIS — M6281 Muscle weakness (generalized): Secondary | ICD-10-CM

## 2023-07-21 DIAGNOSIS — M25612 Stiffness of left shoulder, not elsewhere classified: Secondary | ICD-10-CM

## 2023-07-21 DIAGNOSIS — M25512 Pain in left shoulder: Secondary | ICD-10-CM

## 2023-07-27 NOTE — Therapy (Signed)
 OUTPATIENT PHYSICAL THERAPY CERVICAL TREATMENT   Patient Name: John Rowland MRN: [id number] DOB:July 07, 1995, 28 y.o., male Today's Date: 07/28/2023  END OF SESSION:  PT End of Session - 07/28/23 0845     Visit Number 7    Number of Visits 30    Date for PT Re-Evaluation 09/07/23    Authorization Type [employer]    PT Start Time 0845    PT Stop Time 0930    PT Time Calculation (min) 45 min                History reviewed. No pertinent past medical history. History reviewed. No pertinent surgical history. Patient Active Problem List   Diagnosis Date Noted   Sports physical 11/03/2011    PCP: Norvel Beer  REFERRING PROVIDER: Sammye Cristal  REFERRING DIAG: Lt clavicle fx  THERAPY DIAG:  Acute pain of left shoulder  Stiffness of left shoulder, not elsewhere classified  Muscle weakness (generalized)  Closed nondisplaced fracture of left clavicle with routine healing, unspecified part of clavicle, subsequent encounter  Rationale for Evaluation and Treatment: Rehabilitation  ONSET DATE: 05/19/23  SUBJECTIVE:                                                                                                                                                                                                         SUBJECTIVE STATEMENT: Feeling good, I go back to work on Monday.    EVAL- Feb 21st, I was snowboarding I flipped and landed on my left side on my back. Surgery was on the 27th. I feel fine mobility while but with pressure and lifting anything makes it hurt.   Left handed    PERTINENT HISTORY:  Collarbone sx on 05/25/23  PAIN:  Are you having pain? Yes: NPRS scale: 0/10  Pain location: L shoulder and up into neck a bit by upper trap Pain description: sharp shooting when it hurts, after driving it is like a constant ache  Aggravating factors: driving after 20 mins, lifting anything, push ups  Relieving factors: Tylenol   PRECAUTIONS: Other: clavicle  fx   RED FLAGS: None    WEIGHT BEARING RESTRICTIONS: No  FALLS:  Has patient fallen in last 6 months? 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  DIAGNOSTIC FINDINGS:  Not in chart    COGNITION: Overall cognitive status: Within functional limits for tasks assessed  SENSATION: Light touch: Impaired reports numbness from shoulder to center of chest   POSTURE: No Significant postural limitations  PALPATION: Some tightness in L shoulder and pecs    CERVICAL ROM: all WFL  UPPER EXTREMITY ROM:  Active ROM Right eval Left eval  Shoulder flexion  145 w/pain  Shoulder extension    Shoulder abduction  150 w/pain  Shoulder adduction    Shoulder extension    Shoulder internal rotation  WNL  Shoulder external rotation  WNL  Elbow flexion    Elbow extension    Wrist flexion    Wrist extension    Wrist ulnar deviation    Wrist radial deviation    Wrist pronation    Wrist supination     (Blank rows = not tested)  UPPER EXTREMITY MMT:  MMT Right eval Left eval  Shoulder flexion  4/5 with pain  Shoulder extension    Shoulder abduction  4-/5 with pain  Shoulder adduction    Shoulder extension    Shoulder internal rotation  5/5  Shoulder external rotation  5/5  Middle trapezius  4-  Lower trapezius  4-  Elbow flexion    Elbow extension    Wrist flexion    Wrist extension    Wrist ulnar deviation    Wrist radial deviation    Wrist pronation    Wrist supination    Grip strength     (Blank rows = not tested)   TREATMENT DATE:  07/28/23 UBE L3 x50mins Single arm flexion with green band 2x10 Abduction raises with green band 2x10 Horizontal abd green 2x10  Seated row 20#  2x10 Lat pull down 20# 2x10 Chest press 15# 2x10 3 way extension with 3# 2x10 SL adduction with cable 5# 2x10 Pec stretch 15s x2 Push up from mat table 4x5 lowering table each time  Overhead carry 5# 2 laps  PROM to L shoulder, stretching with end range holds    07/21/23 UBE L3 x43mins each Wall push up 2x10 Shoulder taps standing at wall 2x10 alt  3 way reaches on wall red band 2x10  Face pull with red band 2x10 Body blade working on stability 3 x30s both ways Chest press 10# 2x10 Prone IYT with 3# 2x10 Arnold curl 3# 2x10  SA lift red 2x10    07/19/23 UBE L3 3 min each way Cable  ER 2x10 10#   lateral raise 10# 2x10  Front raise10# 2x12  Ext. 10#  2x12 Red band wall taps 3 targets 2x10 each Pushup with a plus 3x12- elevated table Bent over row 2x10 7# Prone rev fly 2# 2x10- cue to squeeze shoulder blades together   07/12/23 NuStep L5x69mins  Green band rows and ext 2x10 Red band ER 2x10 IYT 3# 2x10 Chest press 5# 2x10 Bicep curls 15# 2x10 Tricep ext 25# 2x10 Shoulder ext 5# 2x10 4# bent over row 2x10 4# chest fly 2x10  07/06/23 UBE level 2 x 6 minutes Red tband row 2x10 Red tband extension 2x10 Red tband ER LAts 15# 2x10 Supine 2# punch Supine 1# flexion Supine 1# ER/IR Supine 3# isometric circles Passive stretch left shoulder all motions to his tolerance  07/04/23 UBE level 2 x 4 minutes Ball rolling up wall Wall circles Green tband row Green tband extension Prone 4# row and extension Prone no weight, 2# vectors Supine 3# chest press small angle Supine 4# serratus 4#  isometric circles Supine 1# flexion small ROM Supine 2# ER/IR Passive stretch abduction and flexion 25# triceps 10# biceps  06/29/23- EVAL                                                                                                                                 PATIENT EDUCATION:  Education details: POC, HEP, protocol to follow in order to prevent damage  Person educated:  Patient Education method: Explanation Education comprehension: verbalized understanding  HOME EXERCISE PROGRAM: Access Code: [email or web address] URL: [email or web address] Date: 06/29/2023 Prepared by: Donavon Fudge  Exercises - Shoulder External Rotation with Anchored Resistance  - 1 x daily - 7 x weekly - 2 sets - 10 reps - Standing Shoulder Internal Rotation with Anchored Resistance  - 1 x daily - 7 x weekly - 2 sets - 10 reps - Shoulder External Rotation and Scapular Retraction with Resistance  - 1 x daily - 7 x weekly - 2 sets - 10 reps - Flexion-Extension Shoulder Pendulum with Table Support  - 1 x daily - 7 x weekly - 2 sets - 10 reps - Horizontal Shoulder Pendulum with Table Support  - 1 x daily - 7 x weekly - 2 sets - 10 reps - Circular Shoulder Pendulum with Table Support  - 1 x daily - 7 x weekly - 2 sets - 10 reps  ASSESSMENT:  CLINICAL IMPRESSION: Patient came in with no pain and feels he is making good progress, will return to work on Monday 5/5 on lighter duty. He reports some clicking in his shoulder with flexion and abduction raises using the band, denies pain. Some pain in front of shoulder with chest press, cued to not extend arm back as far. He is still tight and stiff with end ranges and in his pecs. States lateral distraction and passive pec stretch felt good. Will continue to benefit from scapular and shoulder strengthening.     Patient is a 28 y.o. male who was seen today for physical therapy evaluation and treatment for left clavicle fracture. He is currently 5 weeks out from his surgery, no longer wearing a sling. The doctor told him he is okay to return to work and do activity as long as it does not hurt. He states he tried to do push ups and his pain levels were at a 10/10 after. Advised him to not do these anymore. He was not given a protocol to follow so I found on online to loosely follow to prevent further damage. We discussed no lifting weights over 5-10lb,  no lifting overhead, avoiding excessive reaching and ER/IR. 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   OBJECTIVE IMPAIRMENTS: decreased ROM, decreased strength, and pain.   ACTIVITY LIMITATIONS: carrying, lifting, and reach over head  PARTICIPATION LIMITATIONS: cleaning, laundry, driving, occupation, and yard work  [facility] POTENTIAL: Good  CLINICAL DECISION MAKING: Stable/uncomplicated  EVALUATION COMPLEXITY: Low  GOALS:  Goals reviewed with patient? Yes  SHORT TERM GOALS: Target date: 08/03/23  Patient will be independent with initial HEP.  Baseline:  Goal status: met 07/06/23   LONG TERM GOALS: Target date: 09/07/23  Patient will be independent with advanced/ongoing HEP to improve outcomes and carryover.  Baseline:  Goal status: INITIAL  2.  Patient will report 75% improvement in L shoulder and clavicle pain with activity to improve QOL.  Baseline: depends on the activity it can be between 6-10/10 Goal status: INITIAL  3.  Patient to improve L shoulder AROM to [facility] without pain provocation to allow for increased ease of ADLs.  Baseline: limited flexion and abd with pain Goal status: INITIAL  4.  Patient will be able to drive over 30 mins without pain to complete work tasks    Baseline: pain after 20 mins of driving Goal status: INITIAL   5. Patient will be able to complete push up progression (wall --> table --> full) after 12 weeks Baseline: avoid until routine healing  Goal status: INITIAL    PLAN:  PT FREQUENCY: 1-2x/week  PT DURATION: 10 weeks  PLANNED INTERVENTIONS: 97110-Therapeutic exercises, 97530- Therapeutic activity, 97112- Neuromuscular re-education, 97535- Self Care, [id number]- Manual therapy, Patient/Family education, Taping, Joint mobilization, Spinal mobilization, Cryotherapy, and Moist heat  PLAN FOR NEXT SESSION: Progress resistance strengthening as tolerated, how was work    [employer], PT,  SPTA 07/28/2023, 9:27 AM

## 2023-07-28 ENCOUNTER — Ambulatory Visit: Attending: Orthopedic Surgery

## 2023-07-28 DIAGNOSIS — M25612 Stiffness of left shoulder, not elsewhere classified: Secondary | ICD-10-CM

## 2023-07-28 DIAGNOSIS — S42002D Fracture of unspecified part of left clavicle, subsequent encounter for fracture with routine healing: Secondary | ICD-10-CM

## 2023-07-28 DIAGNOSIS — M6281 Muscle weakness (generalized): Secondary | ICD-10-CM | POA: Diagnosis present

## 2023-07-28 DIAGNOSIS — M25512 Pain in left shoulder: Secondary | ICD-10-CM

## 2023-08-02 ENCOUNTER — Encounter: Payer: Self-pay | Admitting: Physical Therapy

## 2023-08-02 ENCOUNTER — Ambulatory Visit: Admitting: Physical Therapy

## 2023-08-02 DIAGNOSIS — M25612 Stiffness of left shoulder, not elsewhere classified: Secondary | ICD-10-CM

## 2023-08-02 DIAGNOSIS — M6281 Muscle weakness (generalized): Secondary | ICD-10-CM

## 2023-08-02 DIAGNOSIS — M25512 Pain in left shoulder: Secondary | ICD-10-CM | POA: Diagnosis not present

## 2023-08-02 NOTE — Therapy (Signed)
 OUTPATIENT PHYSICAL THERAPY CERVICAL TREATMENT   Patient Name: John Rowland MRN: [id number] DOB:1995-06-04, 28 y.o., male Today's Date: 08/02/2023  END OF SESSION:  PT End of Session - 08/02/23 1429     Visit Number 8    Number of Visits 30    Date for PT Re-Evaluation 09/07/23    Authorization Type [employer]    PT Start Time 1430    PT Stop Time 1515    PT Time Calculation (min) 45 min                History reviewed. No pertinent past medical history. History reviewed. No pertinent surgical history. Patient Active Problem List   Diagnosis Date Noted   Sports physical 11/03/2011    PCP: Norvel Beer  REFERRING PROVIDER: Sammye Cristal  REFERRING DIAG: Lt clavicle fx  THERAPY DIAG:  Acute pain of left shoulder  Stiffness of left shoulder, not elsewhere classified  Muscle weakness (generalized)  Rationale for Evaluation and Treatment: Rehabilitation  ONSET DATE: 05/19/23  SUBJECTIVE:                                                                                                                                                                                                         SUBJECTIVE STATEMENT: Feeling good. Some stiffness over R anterior shoulder. He is eager to return to work.    EVAL- Feb 21st, I was snowboarding I flipped and landed on my left side on my back. Surgery was on the 27th. I feel fine mobility while but with pressure and lifting anything makes it hurt.   Left handed    PERTINENT HISTORY:  Collarbone sx on 05/25/23  PAIN:  Are you having pain? Yes: NPRS scale: 0/10  Pain location: L shoulder and up into neck a bit by upper trap Pain description: sharp shooting when it hurts, after driving it is like a constant ache  Aggravating factors: driving after 20 mins, lifting anything, push ups  Relieving factors: Tylenol   PRECAUTIONS: Other: clavicle fx   RED FLAGS: None    WEIGHT BEARING RESTRICTIONS: No  FALLS:  Has  patient fallen in last 6 months? 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  DIAGNOSTIC FINDINGS:  Not in chart    COGNITION: Overall cognitive status:  Within functional limits for tasks assessed  SENSATION: Light touch: Impaired reports numbness from shoulder to center of chest   POSTURE: No Significant postural limitations  PALPATION: Some tightness in L shoulder and pecs    CERVICAL ROM: all WFL  UPPER EXTREMITY ROM:  Active ROM Right eval Left eval  Shoulder flexion  145 w/pain  Shoulder extension    Shoulder abduction  150 w/pain  Shoulder adduction    Shoulder extension    Shoulder internal rotation  WNL  Shoulder external rotation  WNL  Elbow flexion    Elbow extension    Wrist flexion    Wrist extension    Wrist ulnar deviation    Wrist radial deviation    Wrist pronation    Wrist supination     (Blank rows = not tested)  UPPER EXTREMITY MMT:  MMT Right eval Left eval  Shoulder flexion  4/5 with pain  Shoulder extension    Shoulder abduction  4-/5 with pain  Shoulder adduction    Shoulder extension    Shoulder internal rotation  5/5  Shoulder external rotation  5/5  Middle trapezius  4-  Lower trapezius  4-  Elbow flexion    Elbow extension    Wrist flexion    Wrist extension    Wrist ulnar deviation    Wrist radial deviation    Wrist pronation    Wrist supination    Grip strength     (Blank rows = not tested)   TREATMENT DATE:  08/02/23 UBE L3 each way Red badn around arms tapping targets x10 each target 3# wall slides  Green band ER 2x10  3# flexion, abd 2x10 each Lats 20# x12, 25# x12 Rows 20# 2x10 Chest press 15# 2x10 Tricep ext 20#  2x10 Bicep curl 20# 2x10 Doorframe pec stretch     07/28/23 UBE L3 x28mins Single arm flexion with green band 2x10 Abduction raises with green band 2x10 Horizontal abd green 2x10  Seated row 20# 2x10 Lat pull down 20# 2x10 Chest press 15# 2x10 3 way extension with 3# 2x10 SL adduction with cable 5# 2x10 Pec stretch 15s x2 Push up from mat table 4x5 lowering table each time  Overhead carry 5# 2 laps  PROM to L shoulder, stretching with end range holds    07/21/23 UBE L3 x87mins each Wall push up 2x10 Shoulder taps standing at wall 2x10 alt  3 way reaches on wall red band 2x10  Face pull with red band 2x10 Body blade working on stability 3 x30s both ways Chest press 10# 2x10 Prone IYT with 3# 2x10 Arnold curl 3# 2x10  SA lift red 2x10    07/19/23 UBE L3 3 min each way Cable  ER 2x10 10#   lateral raise 10# 2x10  Front raise10# 2x12  Ext. 10#  2x12 Red band wall taps 3 targets 2x10 each Pushup with a plus 3x12- elevated table Bent over row 2x10 7# Prone rev fly 2# 2x10- cue to squeeze shoulder blades together   07/12/23 NuStep L5x6mins  Green band rows and ext 2x10 Red band ER 2x10 IYT 3# 2x10 Chest press 5# 2x10 Bicep curls 15# 2x10 Tricep ext 25# 2x10 Shoulder ext 5# 2x10 4# bent over row 2x10 4# chest fly 2x10  07/06/23 UBE level 2 x 6 minutes Red tband row 2x10 Red tband extension 2x10 Red tband ER LAts 15# 2x10 Supine 2# punch Supine 1# flexion Supine 1# ER/IR Supine 3# isometric circles Passive stretch left  shoulder all motions to his tolerance  07/04/23 UBE level 2 x 4 minutes Ball rolling up wall Wall circles Green tband row Green tband extension Prone 4# row and extension Prone no weight, 2# vectors Supine 3# chest press small angle Supine 4# serratus 4# isometric circles Supine 1# flexion small ROM Supine 2# ER/IR Passive stretch abduction and flexion 25# triceps 10# biceps  06/29/23- EVAL                                                                                                                                  PATIENT EDUCATION:  Education details: POC, HEP, protocol to follow in order to prevent damage  Person educated: Patient Education method: Explanation Education comprehension: verbalized understanding  HOME EXERCISE PROGRAM: Access Code: [email or web address] URL: [email or web address] Date: 06/29/2023 Prepared by: Donavon Fudge  Exercises - Shoulder External Rotation with Anchored Resistance  - 1 x daily - 7 x weekly - 2 sets - 10 reps - Standing Shoulder Internal Rotation with Anchored Resistance  - 1 x daily - 7 x weekly - 2 sets - 10 reps - Shoulder External Rotation and Scapular Retraction with Resistance  - 1 x daily - 7 x weekly - 2 sets - 10 reps - Flexion-Extension Shoulder Pendulum with Table Support  - 1 x daily - 7 x weekly - 2 sets - 10 reps - Horizontal Shoulder Pendulum with Table Support  - 1 x daily - 7 x weekly - 2 sets - 10 reps - Circular Shoulder Pendulum with Table Support  - 1 x daily - 7 x weekly - 2 sets - 10 reps  ASSESSMENT:  CLINICAL IMPRESSION: Patient came in with no pain, but had some stiffness over anterior shoulder.  He tolerated all exercises with mild irritation in flexion and abduction above 90*. I encouraged him to continue his HEP, but don't push shoulder too hard to avoid an interruption in the healing process. He would benefit from skilled PT to progress shoulder strength so he can return to work.   Patient is a 28 y.o. male who was seen today for physical therapy evaluation and treatment for left clavicle fracture. He is currently 5 weeks out from his surgery, no longer wearing a sling. The doctor told him he is okay to return to work and do activity as long as it does not hurt. He states he tried to do push ups and his pain levels were at a 10/10 after. Advised him to not do these anymore. He was not given a protocol to follow so I found on online to loosely follow  to prevent further damage. We discussed no lifting weights over 5-10lb, no lifting overhead, avoiding excessive reaching and ER/IR. 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   OBJECTIVE IMPAIRMENTS: decreased ROM, decreased strength, and pain.   ACTIVITY LIMITATIONS: carrying, lifting, and reach over head  PARTICIPATION LIMITATIONS: cleaning, laundry,  driving, occupation, and yard work  [facility] POTENTIAL: Good  CLINICAL DECISION MAKING: Stable/uncomplicated  EVALUATION COMPLEXITY: Low  GOALS: Goals reviewed with patient? Yes  SHORT TERM GOALS: Target date: 08/03/23  Patient will be independent with initial HEP.  Baseline:  Goal status: met 07/06/23   LONG TERM GOALS: Target date: 09/07/23  Patient will be independent with advanced/ongoing HEP to improve outcomes and carryover.  Baseline:  Goal status: INITIAL  2.  Patient will report 75% improvement in L shoulder and clavicle pain with activity to improve QOL.  Baseline: depends on the activity it can be between 6-10/10 Goal status: INITIAL  3.  Patient to improve L shoulder AROM to [facility] without pain provocation to allow for increased ease of ADLs.  Baseline: limited flexion and abd with pain Goal status: INITIAL  4.  Patient will be able to drive over 30 mins without pain to complete work tasks    Baseline: pain after 20 mins of driving Goal status: INITIAL   5. Patient will be able to complete push up progression (wall --> table --> full) after 12 weeks Baseline: avoid until routine healing  Goal status: INITIAL    PLAN:  PT FREQUENCY: 1-2x/week  PT DURATION: 10 weeks  PLANNED INTERVENTIONS: 97110-Therapeutic exercises, 97530- Therapeutic activity, 97112- Neuromuscular re-education, 97535- Self Care, 16109- Manual therapy, Patient/Family education, Taping, Joint mobilization, Spinal mobilization, Cryotherapy, and Moist heat  PLAN FOR NEXT SESSION: Progress resistance  strengthening as tolerated, how was work    [employer], SPTA 08/02/2023, 2:30 PM

## 2023-08-04 ENCOUNTER — Ambulatory Visit: Admitting: Physical Therapy
